# Patient Record
Sex: Female | Born: 1970 | Race: White | Hispanic: No
Health system: Southern US, Community
[De-identification: ages and names within clinical notes are randomized; demographics above are authoritative.]

## PROBLEM LIST (undated history)

## (undated) DIAGNOSIS — I10 Essential (primary) hypertension: Secondary | ICD-10-CM

---

## 1999-12-02 ENCOUNTER — Emergency Department (HOSPITAL_COMMUNITY): Admission: EM | Admit: 1999-12-02 | Discharge: 1999-12-03 | Payer: Self-pay | Admitting: Emergency Medicine

## 2001-02-23 ENCOUNTER — Encounter: Payer: Self-pay | Admitting: Family Medicine

## 2001-02-23 ENCOUNTER — Ambulatory Visit (HOSPITAL_COMMUNITY): Admission: RE | Admit: 2001-02-23 | Discharge: 2001-02-23 | Payer: Self-pay | Admitting: Family Medicine

## 2001-09-14 ENCOUNTER — Ambulatory Visit (HOSPITAL_COMMUNITY): Admission: RE | Admit: 2001-09-14 | Discharge: 2001-09-14 | Payer: Self-pay | Admitting: Family Medicine

## 2001-09-14 ENCOUNTER — Encounter: Payer: Self-pay | Admitting: Family Medicine

## 2003-04-29 ENCOUNTER — Encounter: Payer: Self-pay | Admitting: Family Medicine

## 2003-04-29 ENCOUNTER — Encounter: Admission: RE | Admit: 2003-04-29 | Discharge: 2003-04-29 | Payer: Self-pay | Admitting: Family Medicine

## 2003-04-29 ENCOUNTER — Other Ambulatory Visit: Admission: RE | Admit: 2003-04-29 | Discharge: 2003-04-29 | Payer: Self-pay | Admitting: Family Medicine

## 2003-06-19 ENCOUNTER — Encounter: Payer: Self-pay | Admitting: Gastroenterology

## 2003-06-19 ENCOUNTER — Encounter: Admission: RE | Admit: 2003-06-19 | Discharge: 2003-06-19 | Payer: Self-pay | Admitting: Gastroenterology

## 2003-07-03 ENCOUNTER — Ambulatory Visit (HOSPITAL_COMMUNITY): Admission: RE | Admit: 2003-07-03 | Discharge: 2003-07-03 | Payer: Self-pay | Admitting: Gastroenterology

## 2003-07-03 ENCOUNTER — Encounter: Payer: Self-pay | Admitting: Gastroenterology

## 2003-07-10 ENCOUNTER — Ambulatory Visit (HOSPITAL_COMMUNITY): Admission: RE | Admit: 2003-07-10 | Discharge: 2003-07-10 | Payer: Self-pay | Admitting: Gastroenterology

## 2003-07-29 ENCOUNTER — Ambulatory Visit (HOSPITAL_COMMUNITY): Admission: RE | Admit: 2003-07-29 | Discharge: 2003-07-29 | Payer: Self-pay | Admitting: Surgery

## 2003-07-29 ENCOUNTER — Encounter (INDEPENDENT_AMBULATORY_CARE_PROVIDER_SITE_OTHER): Payer: Self-pay | Admitting: Specialist

## 2004-07-27 ENCOUNTER — Other Ambulatory Visit: Admission: RE | Admit: 2004-07-27 | Discharge: 2004-07-27 | Payer: Self-pay | Admitting: Obstetrics and Gynecology

## 2004-10-31 ENCOUNTER — Ambulatory Visit (HOSPITAL_COMMUNITY): Admission: RE | Admit: 2004-10-31 | Discharge: 2004-10-31 | Payer: Self-pay | Admitting: Urology

## 2006-07-09 ENCOUNTER — Emergency Department (HOSPITAL_COMMUNITY): Admission: EM | Admit: 2006-07-09 | Discharge: 2006-07-10 | Payer: Self-pay | Admitting: Emergency Medicine

## 2009-07-07 ENCOUNTER — Emergency Department (HOSPITAL_BASED_OUTPATIENT_CLINIC_OR_DEPARTMENT_OTHER): Admission: EM | Admit: 2009-07-07 | Discharge: 2009-07-07 | Payer: Self-pay | Admitting: Emergency Medicine

## 2009-07-07 ENCOUNTER — Ambulatory Visit: Payer: Self-pay | Admitting: Radiology

## 2011-01-29 NOTE — Op Note (Signed)
   NAME:  MAEDELL, HEDGER               ACCOUNT NO.:  [id number]   MEDICAL RECORD NO.:  [id number]                   PATIENT TYPE:  AMB   LOCATION:  ENDO                                 FACILITY:  [facility]   PHYSICIAN:  Anselmo Rod, M.D.               DATE OF BIRTH:  05-May-1971   DATE OF PROCEDURE:  07/03/2003  DATE OF DISCHARGE:                                 OPERATIVE REPORT   PROCEDURE PERFORMED:  Colonoscopy.   ENDOSCOPIST:  Charna Elizabeth, M.D.   INSTRUMENT USED:  Olympus video colonoscope.   INDICATIONS FOR PROCEDURE:  Blood in stool with abdominal pain in a 40-year-  old white female.  Rule out colitis (inflammatory bowel disease).  The  patient has had some right lower quadrant tenderness as well.   PREPROCEDURE PREPARATION:  Informed consent was procured from the patient.  The patient was fasted for eight hours prior to the procedure and prepped  with a bottle of magnesium citrate and a gallon of GoLYTELY the night prior  to the procedure.   PREPROCEDURE PHYSICAL:  The patient had stable vital signs.  Neck supple.  Chest clear to auscultation.  S1 and S2 regular.  Abdomen soft with normal  bowel sounds.   DESCRIPTION OF PROCEDURE:  The patient was placed in left lateral decubitus  position and sedated with 10 mg of Versed for the EGD.  No additional  sedation was used for the colonoscopy.  Once the patient was adequately  sedated and maintained on low flow oxygen and continuous cardiac monitoring,  the Olympus video colonoscope was advanced from the rectum to the cecum and  terminal ileum without difficulty.  The entire exam was normal except for  small internal hemorrhoids.   IMPRESSION:  Normal colonoscopy up to the terminal ileum except for some  internal hemorrhoids.   RECOMMENDATIONS:  1. Continue a high fiber diet with liberal fluid intake.  2. Outpatient follow-up in the next two weeks for further recommendations.                              Anselmo Rod, M.D.    JNM/MEDQ  D:  07/03/2003  T:  07/03/2003  Job:  [id number]   cc:   Talmadge Coventry, M.D.  [address], [address]  [place]  [place] [address]  Fax: [phone]

## 2011-01-29 NOTE — Op Note (Signed)
NAME:  Kathleen Klein, Kathleen Klein               ACCOUNT NO.:  [id number]   MEDICAL RECORD NO.:  [id number]                   PATIENT TYPE:  OIB   LOCATION:  2550                                 FACILITY:  [facility]   PHYSICIAN:  Abigail Miyamoto, M.D.              DATE OF BIRTH:  [birth year]   DATE OF PROCEDURE:  07/29/2003  DATE OF DISCHARGE:                                 OPERATIVE REPORT   PREOPERATIVE DIAGNOSIS:  1. Biliary dyskinesia.  2. History of hepatitis.   POSTOPERATIVE DIAGNOSIS:  1. Biliary dyskinesia.  2. History of hepatitis.   PROCEDURE:  1. Laparoscopic cholecystectomy with intraoperative cholangiogram.  2. True cut liver biopsy.   SURGEON:  Abigail Miyamoto, M.D.   ASSISTANT:  Rose Phi. Maple Hudson, M.D.   ANESTHESIA:  General endotracheal anesthesia.   ESTIMATED BLOOD LOSS:  Minimal.   FINDINGS:  The patient was found to have a normal cholangiogram.  The liver  was likewise normal in appearance without any evidence of cirrhosis.   DESCRIPTION OF PROCEDURE:  The patient was brought to the operating room and  identified as Kathleen Klein.  She was placed supine on the  operating table and general anesthesia was induced.  Her abdomen was then  prepped and draped in the usual sterile fashion.  Using a #15 blade a small  transverse incision was made below the umbilicus.  This incision was carried  down to the fascia which was then opened with a scalpel.  Hemostat was then  used to pass into the peritoneal cavity.  Next, a 0 Vicryl pursestring  suture was placed around the fascial opening.  The Hasson port was placed  through the opening and insufflation of the abdomen was begun.  A 12 mm port  was then placed in the patient's epigastrium and two 5 mm ports were placed  in the patient's right flank under direct vision.  The gallbladder was then  grasped and retracted above the liver bed.  Dissection was then carried out  at the base of the gallbladder.  The  cystic duct was dissected out, clipped  once distally, and then partly transected with laparoscopic scissors.  A  small incision was then made in the patient's right upper quadrant.  The  [facility] was passed through this under direct vision and then  placed into the opening of the cystic duct.  The cholangiogram was then  performed under direct fluoroscopy with contrast.  Contrast was seen to flow  into the entire biliary system without evidence of abnormality.  The  cholangiocatheter was then completely removed.  The cystic duct was then  clipped three times proximally and completely transected.  The cystic artery  was identified and clipped twice proximally, once distally and transected as  well.  Prior to removing the gallbladder, a true cut needle was placed  through the small incision in the right upper quadrant.  Two separate liver  biopsies were then performed under direct vision with a  True Cut biopsy  needle.  Specimen was sent to pathology in formalin for identification.  The  biopsy site was then cauterized with electrocautery.  Hemostasis at the  biopsy site appeared to be achieved.  The gallbladder was then slowly  dissected free from the liver bed with the electrocautery.  Once it was free  from the liver bed, it was placed in an Endosac and removed through the  incision at the umbilicus.  The liver bed and dome of the liver were again  examined and hemostasis was felt to be achieved at both sites.  The 0 Vicryl  at the umbilicus was tied in place closing the fascial defect once the  gallbladder was completely removed.  The abdomen was then copiously  irrigated with normal saline.  Hemostasis again appeared to be achieved.  All ports were then removed under direct vision and the abdomen was  deflated.  All incisions were anesthetized with 0.25% Marcaine and closed  with 4-0 Monocryl subcuticular sutures.  Steri-Strips, gauze, and tape were  then applied.  The  patient tolerated the procedure well.  All needle,  sponge, and instrument counts correct at the end of the procedure.  The  patient was then extubated in the operating room and taken in  stable  condition to the recovery room.                                               Abigail Miyamoto, M.D.    DB/MEDQ  D:  07/29/2003  T:  07/29/2003  Job:  [id number]

## 2011-01-29 NOTE — Op Note (Signed)
   NAME:  Kathleen Klein, Kathleen Klein               ACCOUNT NO.:  [id number]   MEDICAL RECORD NO.:  [id number]                   PATIENT TYPE:  AMB   LOCATION:  ENDO                                 FACILITY:  [facility]   PHYSICIAN:  Anselmo Rod, M.D.               DATE OF BIRTH:  [birth year]   DATE OF PROCEDURE:  07/03/2003  DATE OF DISCHARGE:                                 OPERATIVE REPORT   PROCEDURE PERFORMED:  Esophagogastroduodenoscopy.   ENDOSCOPIST:  Charna Elizabeth, M.D.   INSTRUMENT USED:  Olympus video panendoscope.   INDICATIONS FOR PROCEDURE:  Abdominal pain and blood in stool, rule out  peptic ulcer disease, esophagitis, gastritis, etc.   PREPROCEDURE PREPARATION:  Informed consent was procured from the patient.  The patient was fasted for eight hours prior to the procedure.   PREPROCEDURE PHYSICAL:  The patient had stable vital signs.  Neck supple,  chest clear to auscultation.  S1, S2 regular.  Abdomen soft with normal  bowel sounds.   DESCRIPTION OF PROCEDURE:  The patient was placed in the left lateral  decubitus position and sedated with 100 mg of Demerol and 10 mg of Versed  intravenously.  Once the patient was adequately sedated and maintained on  low-flow oxygen and continuous cardiac monitoring, the Olympus video  panendoscope was advanced through the mouth piece over the tongue into the  esophagus under direct vision.  The entire esophagus appeared normal with no  evidence of ring, stricture, masses, esophagitis or Barrett's mucosa.  The  scope was then advanced to the stomach.  The entire gastric mucosa appeared  normal.  So did the proximal small bowel.   IMPRESSION:  Normal esophagogastroduodenoscopy.   RECOMMENDATIONS:  Proceed with a colonoscopy at this time.  Further  recommendations made thereafter.                                                Anselmo Rod, M.D.    JNM/MEDQ  D:  07/03/2003  T:  07/03/2003  Job:  [id number]   cc:   Talmadge Coventry, M.D.  [address], [address]  [place]  [place] [address]  Fax: [phone]

## 2012-01-15 ENCOUNTER — Ambulatory Visit: Payer: Self-pay | Admitting: Orthopaedic Surgery

## 2012-01-15 ENCOUNTER — Inpatient Hospital Stay: Payer: Self-pay | Admitting: General Practice

## 2012-01-16 LAB — CBC WITH DIFFERENTIAL/PLATELET
Basophil #: 0.1 10*3/uL (ref 0.0–0.1)
HCT: 36.9 % (ref 35.0–47.0)
Lymphocyte %: 20.7 %
Neutrophil #: 7.9 10*3/uL — ABNORMAL HIGH (ref 1.4–6.5)
Neutrophil %: 72.7 %
RBC: 4.32 10*6/uL (ref 3.80–5.20)
RDW: 13.9 % (ref 11.5–14.5)

## 2013-02-03 IMAGING — CR DG TIBIA/FIBULA 2V*L*
1 series · 3 of 3 positions shown · non-contrast
Comparison: none

REASON FOR EXAM: fall/leg pain
COMMENTS:

[Series 1: x tib-fib ap left · 0.14mm/px · 3 of 3 slices shown]
[im 1/3]
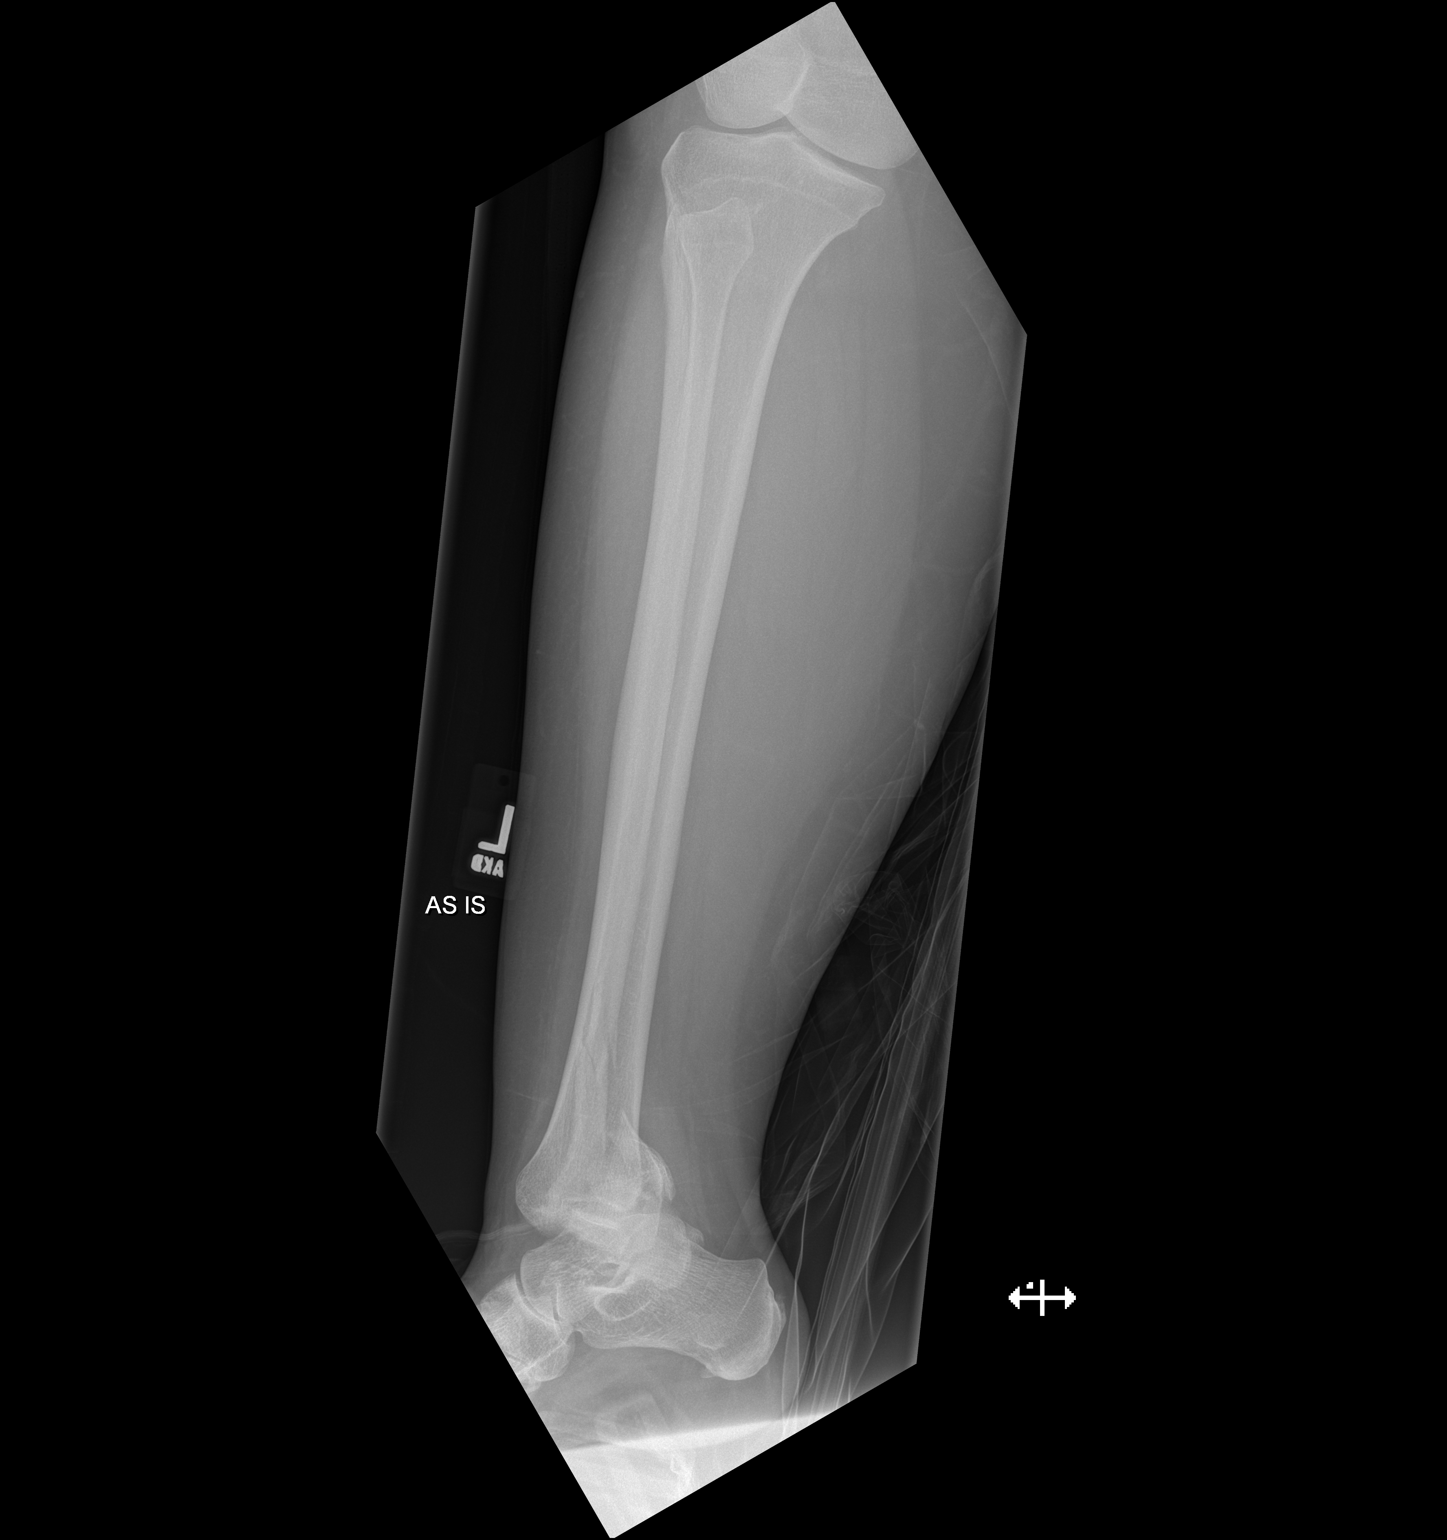
[im 2/3]
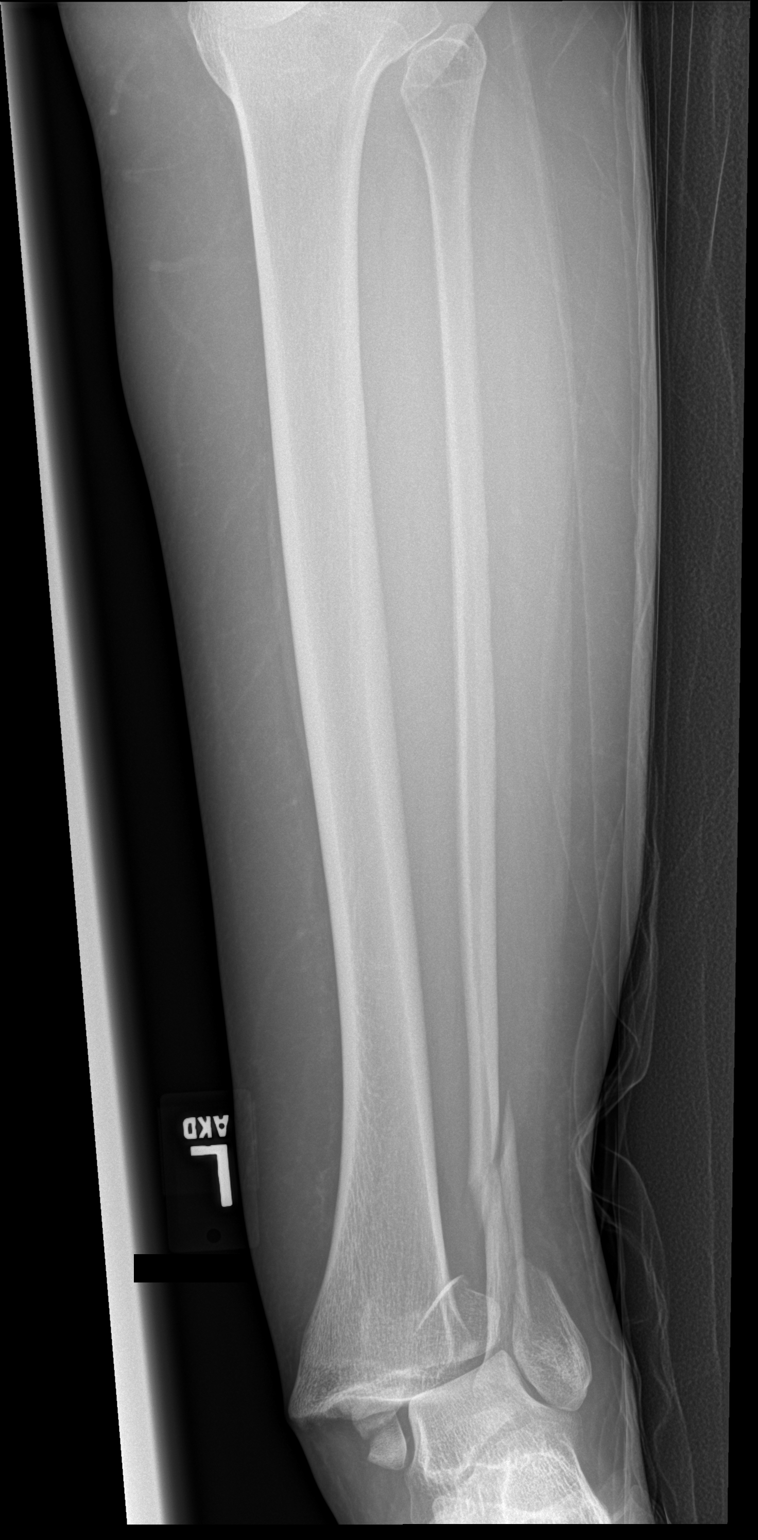
[im 3/3]
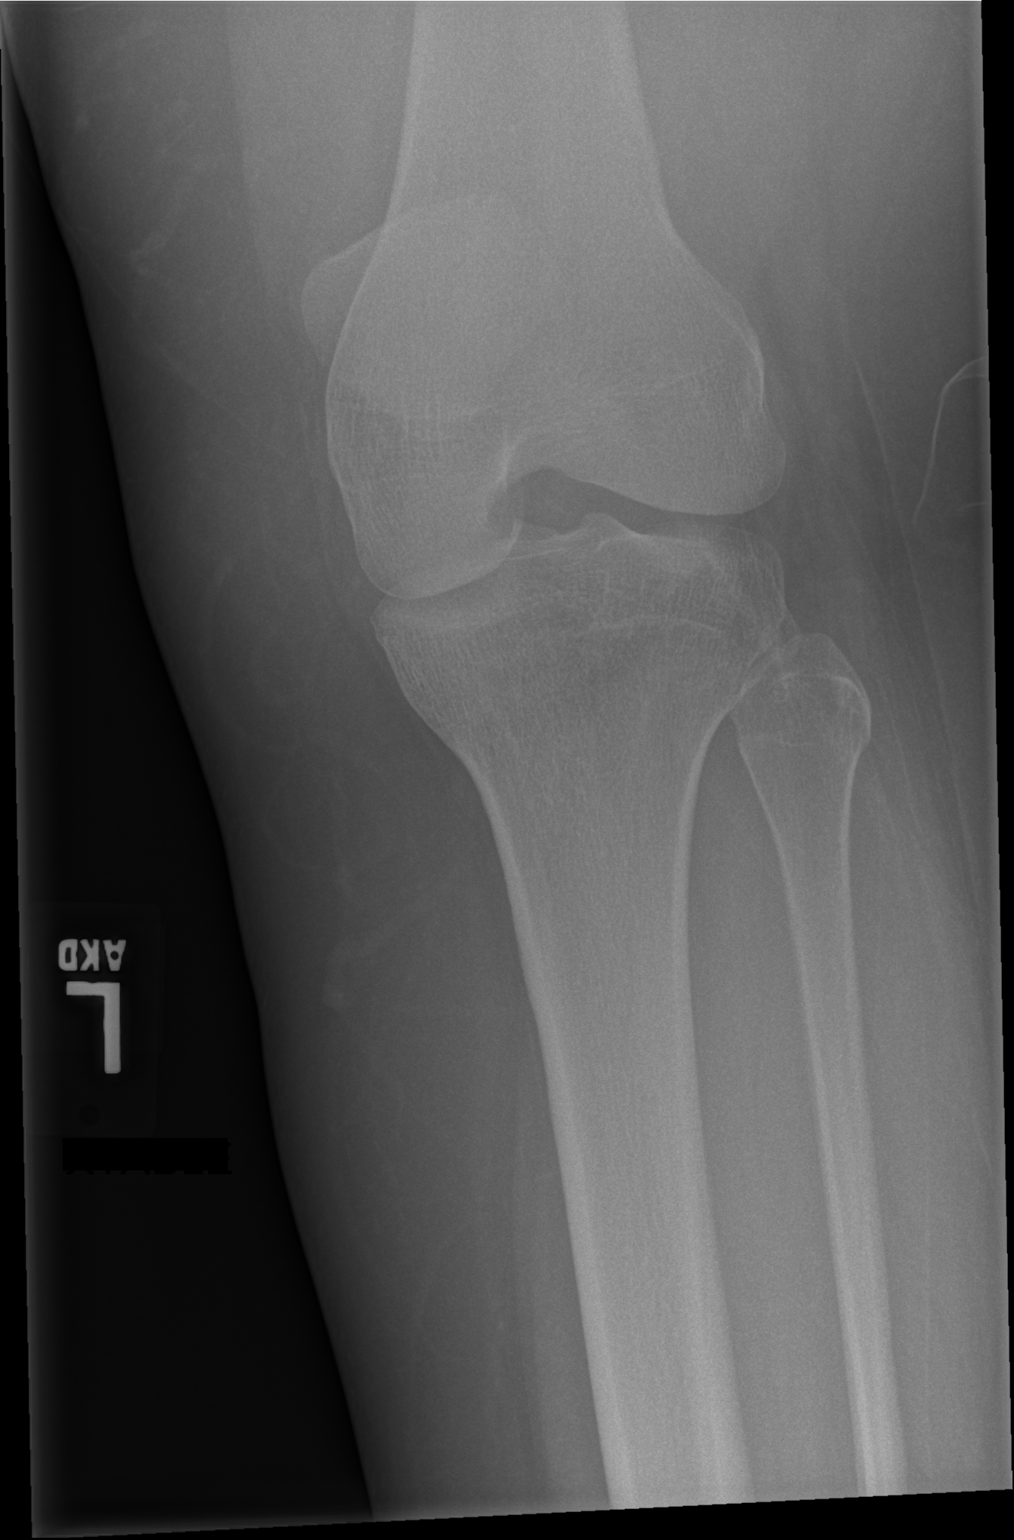

[3 of 3 positions shown; findings below may reference images not displayed]

RESULT:     Three views of the left tibia and fibula are submitted.

The patient has a complex fracture of the ankle. There is a spiral fracture
of the distal aspect of the shaft of the fibula that is mildly displaced.
More proximally the shaft of the fibula is intact. The tibia appears intact
proximally. The observed portions of the knee are intact.
IMPRESSION: There is a complex fracture involving the distal tibia and fibula at the
ankle. A spiral fracture extends into the distal third of the shaft of the
fibula. More proximally I see no fractures of the left tibia or fibula.

[REDACTED]

## 2013-02-04 IMAGING — CR DG C-ARM 1-60 MIN
1 series · 4 of 4 positions shown · non-contrast
Comparison: none

REASON FOR EXAM: ORIF left ankle
COMMENTS:

[Series 6002: left ankle · 4 of 4 slices shown]
[im 1/4]
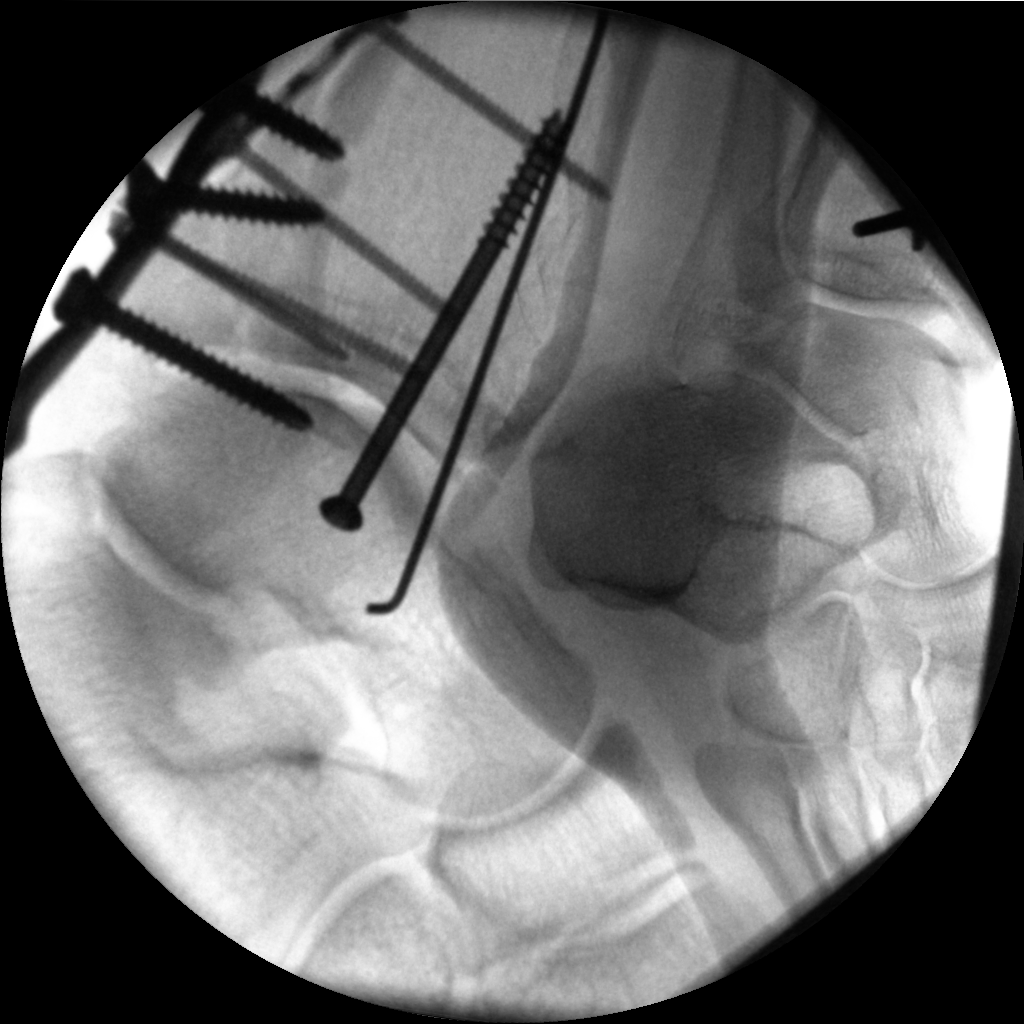
[im 2/4]
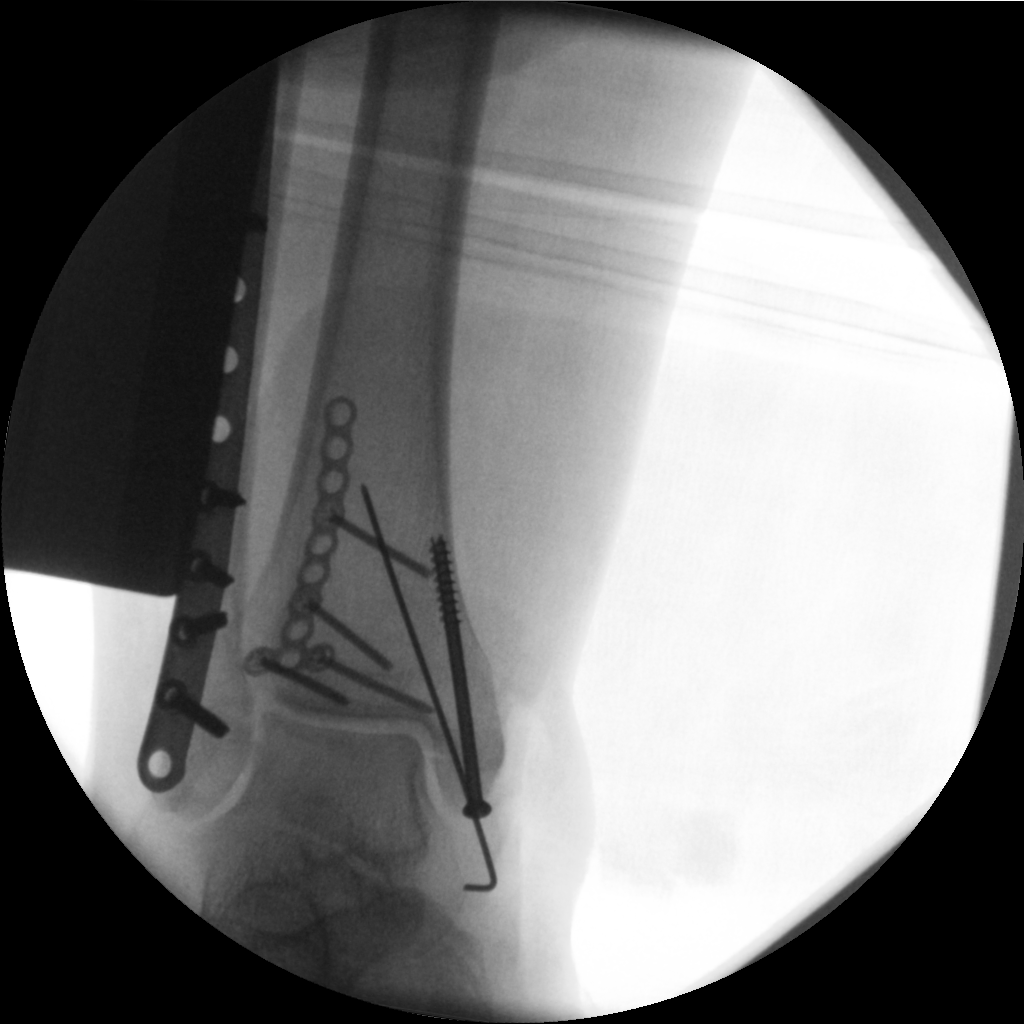
[im 3/4]
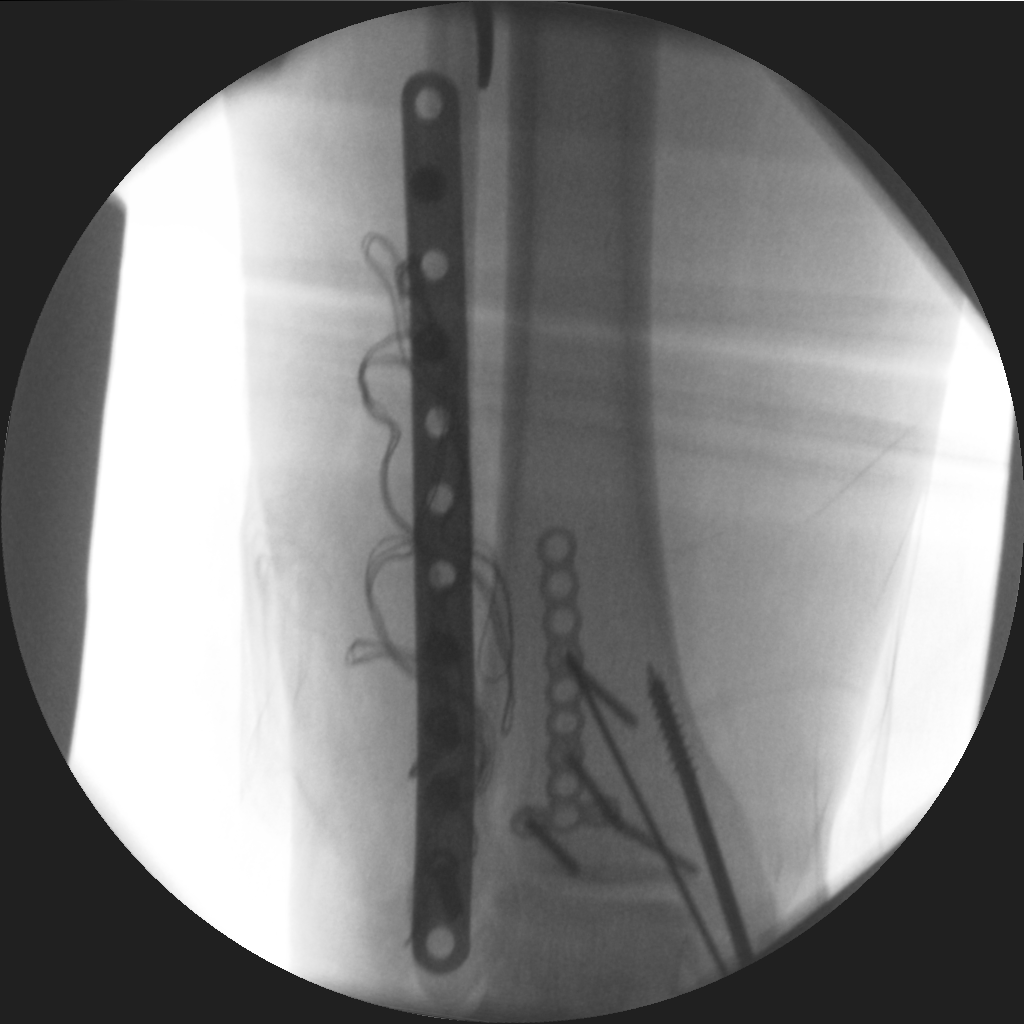
[im 4/4]
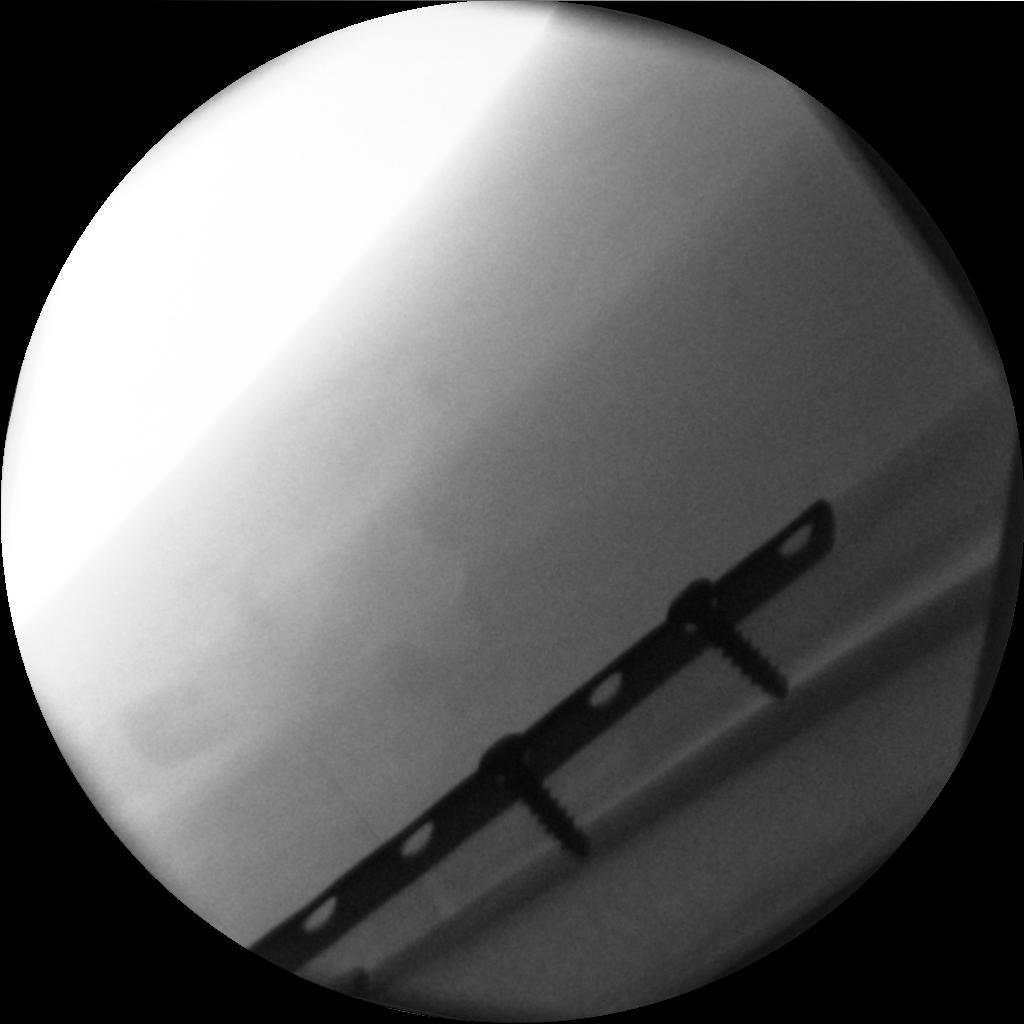

[4 of 4 positions shown; findings below may reference images not displayed]

PROCEDURE:     DXR - DXR C-ARM WITH 2 VIEWS LT ANKLE  - January 16, 2012  [DATE]

RESULT:     Four spot films from the operating room are submitted. The
patient has undergone ORIF for a trimalleolar fracture. An orthopedic side
plate with compression screws have been used to fix the fibular fracture.
The medial and posterior malleolar fractures have been reduced using pins
and screws. The ankle joint mortise is preserved.
IMPRESSION: The patient is undergone ORIF for a trimalleolar fracture.
Alignment now appears near anatomic. Further interpretation is deferred to
Dr. Bolle.

[REDACTED]

## 2015-01-05 NOTE — Discharge Summary (Signed)
PATIENT NAME:  Kathleen Klein, Kathleen Klein MR#:  [id number] DATE OF BIRTH:  06-May-1971  DATE OF ADMISSION:  01/15/2012 DATE OF DISCHARGE:  01/19/2012  ADMITTING DIAGNOSIS: Trimalleolar left ankle fracture.   DISCHARGE DIAGNOSIS: Trimalleolar left ankle fracture.   HISTORY: The patient is a pleasant 44 year old who slipped and fell on wet grass on a hillside earlier on the day of admission. She denied any other problems or any loss of consciousness. She was unable to bear weight or ambulate. She was subsequently brought to [facility] Emergency Room by EMS. X-rays taken in the hospital revealed a trimalleolar ankle fracture. She was seen by Dr. Verlon SettingSikes who consulted the patient on her options. It was recommended that she proceed with having ORIF of the left ankle. The patient understood the risks and benefits and agreed with plans for surgical intervention.   PROCEDURE: Open reduction and internal fixation of left trimalleolar ankle fracture. Date of surgery was 01/16/2012. Surgeon was Dr. Crist Infanteharles Sikes.  ANESTHESIA: General.   IMPLANTS UTILIZED: 12 hole, 1/3 semitubular Synthes plate with cannulated and partially cannulated threaded screws.   HOSPITAL COURSE: The patient tolerated the procedure very well. She had no complications. She was then taken to the PAC-U where she was stabilized and then transferred to the orthopedic floor. The patient began receiving anticoagulation therapy of Lovenox 30 mg subcutaneous every 12 hours per anesthesia protocol. The left leg was elevated on three pillows. Polar Care was applied to the surgical leg maintaining a temperature of 40 to 50 degrees Fahrenheit. A stocking was applied to the nonoperative leg. Her calves have been nontender and free of any evidence of any deep venous thromboses of the lower extremity. She moves her toes well on the surgical leg. She has normal sensation to touch. Minimal swelling of the toes is noted.   The patient's vital signs  have been stable. She has been afebrile. Hemodynamically she was stable. No transfusions were needed.   Physical therapy was initiated on day one for gait training and transfers. Initially this was extremely slow secondary to what appeared to be a lot of emotional upset. Every time anything was done she would cry. However, on day two, she did improve and was able to ambulate 100 feet. On day three, she was able to go up and down four sets of steps. She was noted to be nauseated throughout the hospital course until day of discharge. This was initially felt to be secondary to the morphine which was discontinued but she continued to have symptoms. However, once the oxycodone was discontinued she improved and actually was getting along very well with just the Ultram and Protonix. She had no other complications.   The patient is being discharged to home in improved stable condition. She is to be nonweightbearing to the left leg. A TED stocking to the nonsurgical leg. She may remove this during the nighttime hours. She is placed on a regular diet. She is to use a regular walker. She will follow-up in the clinic on 01/31/2012, sooner if any temperatures or any excessive bleeding. She was given a prescription for Ultram as well as Protonix. If she is unable to afford the Protonix, she may take Pepcid. She is to resume any regular medication that she was on prior to admission.   PAST MEDICAL HISTORY: Obesity, otherwise unremarkable. ____________________________ Van ClinesJon Alonna Bartling, PA jrw:slb D: 01/19/2012 13:56:40 ET T: 01/19/2012 14:36:39 ET JOB#: [id number]  cc: Van ClinesJon Shinika Estelle, PA, <Dictator> Natarsha Hurwitz PA ELECTRONICALLY SIGNED 01/19/2012 20:40

## 2015-01-05 NOTE — Op Note (Signed)
PATIENT NAME:  Kathleen Klein, Kathleen Klein MR#:  [id number] DATE OF BIRTH:  1970-10-28  DATE OF PROCEDURE:  01/16/2012  PREOPERATIVE DIAGNOSIS: Left  trimalleolar ankle fracture.   POSTOPERATIVE DIAGNOSIS: Left trimalleolar ankle fracture.  PROCEDURE: Open reduction internal fixation of left trimalleolar ankle fracture.   SURGEON: Crist Infanteharles Jovin Fester, MD   ANESTHESIA: General.   COMPLICATIONS: None.   SPECIMENS: None.   DRAINS: None.   ESTIMATED BLOOD LOSS: 100 mL.  TOTAL TOURNIQUET TIME: 128 minutes.   INDICATIONS FOR THE PROCEDURE: The patient is a 44 year old female who on the day prior to her procedure presented to the Emergency Department after a slip and fall on a wet grassy hillside, with the above-mentioned injury. She underwent conscious sedation and closed reduction of that injury at the time of her presentation. She was admitted to the hospital for elevation and observation. She was counseled as to the risks, benefits, and alternatives of operative versus nonoperative management and elected to proceed with operative management of her ankle injury. Informed consent was obtained prior to the procedure.   DESCRIPTION OF PROCEDURE IN DETAIL: The patient was brought to the Operating Room and placed in the prone position following induction of general anesthesia. All bony prominences were well padded, and the patient was secured to the table. The left lower extremity was then prepped and draped in a normal sterile fashion. The limb was exsanguinated with an Esmarch bandage and the tourniquet inflated to 300 mmHg. A standard posterior lateral approach to the ankle using the interval between the peroneal musculature and the flexor hallucis longus was used to approach the posterior aspect of the tibia. The posterior malleolar piece was identified. Intervening soft tissue was removed from the fracture site. The fracture was reduced and pinned in provisional fixation. Provisional fixation was checked with AP  and lateral fluoroscopic views. This was then held in place with a buttress antiglide plate with a screw at the apex of the fracture site and subsequent screws to secure the plate distally. Fracture reduction was maintained and was felt to be adequate at the end of the plate application on AP and lateral fluoroscopic views of the ankle. Attention was then directed to fixation of her comminuted distal fibular fracture. The posterior aspect of the fibula was exposed through the same incision as used for her posterior malleolar approach. A 12-hole one-third semitubular Synthes plate was then used to span the comminuted defect. The plate was contoured distally to match her bony anatomy. It was secured to the distal fragment. The distal fragment was then pulled distally under fluoroscopic guidance to its proper orientation and anatomic alignment with regard to the other aspects of the ankle. This length was then held with provisional fixation of the plate to the most proximal fibular fragment. The intervening fracture sites were then checked for anatomic reduction, and the intervening piece was secured with a lag screw when possible, specifically at the most distal fracture site, with compression across this fracture site through the plate. The fracture pattern and degree of comminution did not allow for lag screw fixation of the more proximal fracture line; therefore, the plate was continued proximally to provide a bridge construct over that area. At least two screws were placed into the intervening fracture fragment to hold it in place, and subsequent proximal screws were secured to the proximal fragment. AP, lateral and mortise fluoroscopic views demonstrated overall maintained alignment length and orientation of the fibula with appropriate screw lengths. Attention was then directed to the exposure of  the medial malleolus. A medial midline incision was made over the tip of the medial malleolus, and there was found to  be significant comminution with a very small anterior portion free from the larger more posterior and medial portion of the medial malleolar fragment. The very small individual piece of the distal medial malleolus was pinned with a K-wire and held in place. There was only room for one subsequent cannulated screw and the remaining posterior fragment; therefore, the threaded guidewire was placed and fracture reduction and guidewire placement were checked on AP and lateral fluoroscopic views. This was found to be sufficient. Therefore, the near cortex was drilled with the drill bit for the 4-0 cannulated screws and the partially threaded cannulated screw was inserted. This achieved good compression through the fracture site and fracture reduction was maintained. The remaining K-wire was clipped near the bony surface, bent and impacted to avoid any sharper irritating edges. It was felt that this was the most stable form of fixation that could be reasonably achieved in such a small bony fragment. The wounds were then irrigated sufficiently with normal saline, and the wounds were closed in layered fashion. The deep fascia of the posterolateral wound was closed loosely with 0 Vicryl. Subcutaneous tissues were closed with interrupted 2-0 Vicryl, and the skin was closed with interrupted 3-0 nylon. The patient was placed into a well-padded posterior and medial lateral splint. She was transferred to her stretcher and awakened from her general anesthesia without complication.   DISPOSITION: The patient will be nonweightbearing to the left lower extremity. She will have deep vein thrombosis prophylaxis with aspirin while she is in the hospital and until she is more sufficiently mobile. She will work with physical therapy to increase her mobility. She will have medications for pain control. We will have the patient follow up in clinic in approximately two weeks for repeat evaluation and likely transition to a short-leg cast.  This appointment will be with Dr. Ernest Pine.   ____________________________ Collier Bullock. Tashay Bozich, MD cvs:cbb D: 01/16/2012 15:03:15 ET T: 01/16/2012 15:22:46 ET JOB#: [id number]  cc: Collier Bullock. Meiah Zamudio, MD, <Dictator> Otilio Saber MD ELECTRONICALLY SIGNED 01/23/2012 22:04

## 2015-01-05 NOTE — H&P (Signed)
    Subjective/Chief Complaint left ankle pain and deformity    History of Present Illness slip and fall on a wet grassy hillside earlier today. no blow to the head or LOC.  no other noted injury.  presented via ems for left ankle pain.   Past Med/Surgical Hx:  Obesity:   ALLERGIES:  Latex: Rash  Family and Social History:   Family History Coronary Artery Disease  Cancer    Social History negative tobacco, negative ETOH    Place of Living Home   Review of Systems:   Fever/Chills No    Cough No    Sputum No    Abdominal Pain No    Diarrhea No    Constipation No    Nausea/Vomiting No    SOB/DOE No    Chest Pain No    Dysuria No    Tolerating Diet Yes    Medications/Allergies Reviewed Medications/Allergies reviewed   Physical Exam:   GEN no acute distress    HEENT hearing intact to voice    NECK supple  non tender to palpation and full ROM without pain    RESP normal resp effort    CARD regular rate    ABD denies tenderness  soft    LYMPH negative inguinal lle    SKIN buising and swelling left lower ext    NEURO follows commands, motor/sensory function intact    PSYCH A+O to time, place, person    Additional Comments LLE EHL/FHL intact, TA/GS unable to asses due to pain sensation intact throughout the foot warm and well perfused, 1-2+ dp, good cap refill deformity of leg above ankle, no open wounds but skin tented over medial distal tibia, significant bruising and swelling present     Assessment/Admission Diagnosis left trimaleolar ankle fracture    Plan NWB LLE elevate LLE closed reduuction and splint application in the ED with conscious sedation by the ED CT scan post splinting for preop planning NPO after midnight reasses swelling in the morning, if ammenable to surgery then procede with ORIF   Electronic Signatures: Otilio SaberSikes, Ave Scharnhorst V (MD)  (Signed [id number]-May-13 22:35)  Authored: CHIEF COMPLAINT and HISTORY, PAST MEDICAL/SURGIAL HISTORY,  ALLERGIES, FAMILY AND SOCIAL HISTORY, REVIEW OF SYSTEMS, PHYSICAL EXAM, ASSESSMENT AND PLAN   Last Updated: 04-May-13 22:35 by Otilio SaberSikes, Sincere Liuzzi V (MD)

## 2016-03-01 ENCOUNTER — Inpatient Hospital Stay: Admit: 2016-03-01 | Primary: Family Medicine

## 2016-03-01 NOTE — Progress Notes (Signed)
[facility]  [address]  [facility], [address]    Patient's Name: Lacey ByesSerena Ward   Age: 45 y.o.  Date of Birth: 09/21/1970   Sex: female    Date:   03/01/2016    Insurance:            Session: 1   Revision:   Surgeon:  Dr. Nickie Retorthastanet    Height: 5 f 4 Weight:    298      Lbs.   BMI: 51.3   Pounds Lost since last month: 0               Pounds Gained since last month: 0    Starting Weight: 298  Previous Month???s Weight: 0  Overall Pounds Lost:  Overall Pounds Gained:       Do you smoke?  None    Alcohol intake:  Number of drinks at a time:  None  Number of times a week: None    Class Guidelines    Guidelines are reviewed with patient at the start of every class.    1. Patient understands that weight loss trial classes must be consecutive.  Patient understands if they miss a class, it is their responsibility to contact me to reschedule class.  I will reach out to patient after their first no show.  2.  Patient understands the expectations that weight maintenance/weight loss is expected during the classes.  Failure to demonstrate changes may result in one extra month of weight loss trial, followed by going back to see the surgeon.    3. Patient is also instructed to be doing their labs, blood work, psych visit, support group and any other test that the surgeon has used while they are working on their weight loss trial.    Other Pertinent Information:     Changes Made Since Last Class: Following a low carbohydrate and low sugar diet.    Eating Habits and Behaviors      Today we spent some time talking about the key diet principles.  Patient was given ideas of protein-based snacks including deli meat, low fat cheese, yogurt, hummus and vegetables, protein drink, or a small handful of nuts.  Patient was instructed to start cutting out the empty carbohydrate-based snacks that include chips, cookies, pretzels, crackers.     We talked about carbohydrates and starting to count daily carbohydrate intake to keep less than 100 grams per day.  Patient is encouraged to fill up on meat and vegetables only.    Patient was given a goal of 64 ounces of fluid a day.  This needs to be non-carbonated, no caffeine, and no sugary drinks.        Patient's current diet habits include: 3 meals a day.  States she has cut down to just 1 slice of bread per day.  She states she is trying to be more mindful of her carbohydrates, but is learning that there are carbohydrates in everything.  She is drinking 64 ounces of water per day.  She has stopped all liquid calories.        Physical Activity/Exercise    Comments:  During class, I discussed with patient the importance of getting into an exercise routine.  We talked about how strength training can help one's metabolism  Currently, patient is riding an exercise bike for activity.  Goals have been set.  I have encouraged patient to purchase a pedometer to track their  steps.    Behavior Modification       Comments:   In class, I did a power point on The 100-200 Mindless Margin.  Studies show that the average person will not know if they ate 100 or 200 more or less calories than usual.  This is called the Mindless Margin.  In other words, one can eat up to 20% fewer calories and not activate the deprivation and craving mechanism in the brain.    Patient was instructed to make a modest daily 100-200 calorie reduction in certain things that the body won't notice.  One, 100-200 calorie change and you will lose 10-20 pounds in a year.    We talked about strategies that may help including Food rules: I will not eat the whole dessert, but rather just 4 bites to satisfy me (pre-op).      Patient was given a check off list with a month's worth of days across the top and was encouraged to come up with a food, exercise, and behavior goal.  Every evening if the goal was achieved, they get a check in the  box.  The goal is for it to be filled with check marks.      One 100-calorie change that the patient is going to make is: Increase her activity to a daily routine.    Adela Ports, MS RD  03/01/2016

## 2016-03-01 NOTE — Progress Notes (Signed)
Nutrition Evaluation    Patient's Name: Lacey ByesSerena Rosal   Age: 45 y.o.  Date of Birth: 03/26/1971   Sex: female    Height: 5 f 4 Weight: 298 BMI:  51.3  Starting Weight:  298      Patient has completed a 1 month weight loss trial.  During the classes, we have focused on 3 components including diet, exercise, and behavior modifications.   Upon completion of the weight loss trial, patient had a good understanding of the 3 components.    The diet component of the weight loss trial emphasized on:  ? Label reading and keeping total fat and sugar less than 3 grams per serving  ?  Proper portion sizes  ?  Drinking 64 ounces of water per day  ? Cutting out simple sugar    The exercise component of the weight loss trial emphasized on:  ? The importance of getting into an exercise routine.  ? Ideas and goals for exercise were discussed with the patient.    The behavior component of the weight loss trial emphasized on:  ? Taking 20-30 minutes to eat a meal.  ? Planning ahead to avoid making poor dietary choices.  ? Not eating in front of the TV or computer    Smoking Status:  None  Alcohol Intake:  Number of Drinks at a Time: None  Number of Times a Week: None    Changes made during classes include:  Lower carbohydrates  Reading labels  Lower sugar  Exercising more        Two things that patient learned during this weight loss trial:  Important to read labels    Must decrease calories, carbohydrates, and exercise in order to lose weight.    Summary:  I feel that Lacey Ward has demonstrated appropriate diet changes and is ready to move forward with surgery.  Patient has been briefed on the importance of the protein drinks, vitamins, and the transition of the diet stages. Patient understands that the long-term diet will focus on protein and vegetables.  Patient understand the effects of carbohydrates after surgery and what reactive hypoglycemia is.      Patient is aware that they will be attending pre-op class 2 weeks before  surgery and will get more detailed information on the post-op diet guidelines.    Patient will see me again at 6 weeks post-op. At this 6 week visit, RD will assess how patient is tolerating soft protein and advance to vegetables, if tolerating soft protein without difficulty.  Patient will also see RD again at 9 months post-op.  This visit will assess patient's compliance with current protocol, including diet, vitamins, protein shakes, and exercise.  Post-op diet guidelines will be reinforced.  RD is available for questions and to meet with patient outside of the 6 week and 9 month post-op visit.     Candidate for surgery: Yes  Re-evaluation Date:     Procedure: Gastric Sleeve     Adela PortsJacqueline Browning, MS RD  03/01/2016

## 2016-03-08 NOTE — H&P (Signed)
[facility]  PRE-OP H AND PS    Name:  Lacey Ward, Lacey Ward  MR#:  [id number]  DOB:  11/17/1970  Account #:  [id number]  Date of Adm:  03/17/2016      DATE OF PLANNED SURGERY:  03/17/2016    PRIMARY CARE PHYSICIAN  Dr. Mila MerryKerry Partis, [facility]  [address]  [place], [place]    PREOPERATIVE DIAGNOSES:  Morbid obesity secondary to excess  food calories, with a body mass index of 49.4.    COMORBIDITIES:  Diabetes with a hemoglobin A1c of 6.9 (new  diagnosis), hypertension, obstructive sleep apnea, metabolic  syndrome, gastroesophageal reflux disease, urinary stress  incontinence, and lower back pain.    PLANNED PROCEDURE  1. Laparoscopic sleeve gastrectomy.  2. Intraoperative pneumatic leak testing.  3. Possible laparoscopic hiatal hernia repair.  4. Possible laparoscopic liver biopsy.    HISTORY OF PRESENT ILLNESS:  The patient is a very pleasant 45-  year-old Caucasian female.  She is 5 feet 4-1/2 inches tall and today  weighs 292.2 pounds, giving her a body mass index of 49.4.  At age  45, she weighed about 200 pounds.  She first noticed a weight problem  while in her teens.  She is G2, P2, and reports no gestational diabetes  with her pregnancies.  She actually had weight loss after the birth of  her children because of lots of nausea and vomiting while pregnant.    She had a slow gradual rise to her weight throughout the years.  About  four years ago, she was diagnosed with rheumatoid arthritis and was  started on prednisone.  Since then, her weight has continuing  increased.  She has tried to lose weight through various home diets.  Her job, however, is sedentary.  She has never been on a weight loss  medications, but has been on injectable medicine for diabetes, but this  did not result in any significant weight loss.  Her initial appointment with  me was Feb 11, 2016.  At that time, she weighed 308 pounds.  This   equates with a body mass index of 52.  She was referred to Registered  Dietician, Gaynell FaceJackson Browning, where she has been educated and is felt  to be a good candidate for weight loss surgery.    Her insurance carrier required a 4027-month weight loss trial.  However,  given her BMI of over 50, this was waived.  She has recently started a liver  shrinking diet, which has caused her weight now to drop below a BMI of 50.  She has also been screened by a Clinical Psychologist, Ruel Favorsana Sari, and is  felt to have no psychological contraindications to weight loss surgery.    PAST MEDICAL HISTORY:  Her past medical history is notable for a  new diagnosis of diabetes.  She also has hypertension and obstructive  sleep apnea, and does use a CPAP machine.  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  She also does meet the  diagnostic criteria for metabolic syndrome.  She also has  hyperlipidemia.    PAST SURGICAL HISTORY:  Her past surgeries have included at total  abdominal hysterectomy at age 45.  This was for prolapse.  She  required a redo surgery because of a bladder injury.  She also had a  laparoscopic cholecystectomy for cholelithiasis at age 45.  She has  also  had a left ankle open reduction and internal fixation, and she has  had a left breast biopsy which was benign.    MEDICATIONS  Her current medication list include:  1. Humira injectable twice a month.  2. Prednisone 5 mg a day.  3. Losartan 50 mg a day.  4. Celebrex 1200 mg at night.  5. Arava 20 mg a day.  6. Atorvastatin 10 mg at bedtime.  7. Omeprazole 40 mg once a day for reflux.    ALLERGIES  1. SHE REPORTS AN ALLERGY TO PENICILLIN, LEADING TO A  RASH.  2. SHE ALSO HAS A CONTACT ALLERGY TO LATEX ONLY IT  TOUCHES HER MUCOUS MEMBRANES.  SHE DOES NOT HAVE A  PROBLEM WITH LATEX ON HER SKIN.    FAMILY HISTORY:  Her mother had rheumatoid arthritis and anxiety   problems.  Her father had diabetes.  He was obese.  He had several  myocardial infarctions, the first at age 45.  She has a brother who is  status post gastric bypass for obesity.    SOCIAL HISTORY:  She does not smoke or drink.  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    REVIEW OF SYSTEMS:  Notable for hypertension.  She has no known  heart disease, atrial fibrillation, or heart failure.  [place] did [place] which was [place].  She is  able to do gardening, housework, and laundry.  Long distances of  walking, however, causes back pain, which does limit the length of  time she exercises.  She does have sleep apnea, with a CPAP  machine on 8 cm of water pressure.  No known thyroid disease.  Her  gallbladder has been removed.  She does have gastroesophageal  reflux disease and no symptoms while on her omeprazole medication.  She has never had an upper endoscopy.  She reports no blood  transfusions.  She is a Jehov[employer] Witness and will not accept any blood  transfusion under any circumstances.  She has arthritis, mostly in her  hands and toes.  She also has chronic lower back pain, with bilateral  sciatica.  She has no venous stasis problems and no history of DVT or  pulmonary embolism.  She denies anemia and easy bleeding.  She  does have anxiety and depression.  No history of stroke, migraines, or  kidney stones.  She does have some urinary stress incontinence  symptoms, but does not wear pads regularly.  She has no history of  polycystic ovarian syndrome.  She does report an EGD and  colonoscopy about 15 years ago, which showed nothing remarkable.  Her mammogram last year was okay.    PHYSICAL EXAMINATION  GENERAL:  On physical exam, she is a well-appearing woman in no  acute distress.  She has an android body habitus.  VITAL SIGNS:  Neck circumference is 16 inches.  Her waist   circumference is 52-1/4 inches.  HEAD AND NECK:  Extraocular movements of her eyes and pupillary  light reflex are normal.  On open mouth exam, the entirety of the uvula  can be seen.  She has relative limited opening of her mouth.  She has  no oral lesions.  Her neck is supple, without adenopathy, thyromegaly,  or neck bruits.  LUNGS:  Clear, without wheezing.  HEART:  Regular rate, without murmurs.  ABDOMEN:  Soft and nontender, with  well-healed trocar incisions.  She also has a well-healed Pfannenstiel incision and no hernia  protrusions on Valsalva testing.  There are no areas of point  tenderness or masses.  EXTREMITIES:  Her lower extremities show no significant peripheral  edema, although she has some slight swelling of the left ankle laterally  where she had her surgery in the past.  There are no venous  hypertensive pigmentation skin changes.  NEUROLOGIC:  Her neurologic exam is nonfocal, with a normal gait.    LABORATORY DATA:  Her most recent laboratory [facility]  [facility],  revealed H. pylori antibody IgG testing was negative.  Calcium was  9.1.  Phosphorus 3.2.  Creatinine 0.62.  Intact parathyroid hormone level  of 29.  B12 level was 371, with a folate of 11.76.  Ferritin was 92.  Hemoglobin A1c was 6.9.  TSH was normal at 1.20, although  laboratory studies through her primary care physician's office showed a  glucose of 119, BUN 11, creatinine was 0.61, sodium 139, potassium  3.9, chloride 106, CO2 of 27, AST 25, and ALT 41.  White blood cell  count was 11.1, hematocrit of 41.3, MCV of 90, and platelet count of  294,000.  A1c was 6.6.  This was on December 24, 2015.  On Jan 29, 2016,  her total cholesterol was 246, with triglycerides of 191 and an HDL of  65.  This was a fasting sample and is compatible with the diagnosis of  metabolic syndrome.  She had a barium swallow on July 03, 2015.   This was done for a globus sensation of a foreign body in the  esophagus.  This study was a negative barium swallow.  There was no  significant hiatal hernia and no reflux noted on this study.  There were  no esophageal masses or ulceration.  The 12 mm barium tablet  passed through the esophagus without difficulty.  This was done at  [facility].  C-reactive protein was elevated  on August 19, 2015, at 1.1.    ASSESSMENT AND PLAN:  This is a woman with morbid obesity.  Her  initial body mass index was 52.  She, one week ago, started a liver  shrinking diet and has lost a few pounds.  Her current BMI is 49.4.  She  has several weight-related comorbidities, including a new diagnosis of  diabetes.  She has hypertension, sleep apnea, metabolic syndrome,  hyperlipidemia, and mild urinary stress incontinence, as well as chronic  lower back pain with bilateral sciatica.  I do believe many of these  problems would greatly improve, if not resolve completely, with  successful weight loss surgery.  She has chosen a laparoscopic sleeve  gastrectomy.  She is aware that if a hiatal hernia is found, this will be  repaired concurrently at the time of her operation.  At today's meeting,  using pictures, I have explained to her in great detail the planned  operation.  We have also discussed some of the risks and  complications of surgery, including bleeding; infection; continuation of  her reflux or possible worsening with the sleeve;  dysphagia; thromboembolic complications; and cardiovascular  complications of heart attack, stroke, and death, although quite  unlikely, have been discussed.  We have also discussed the reasons  for poor weight loss after surgery and how to prevent that.  We have  talked about a leak.  I do plan an intraoperative  pneumatic leak test.  She is aware that if a hiatal hernia is found, it, too, will be corrected,  although her past imaging study did not seem to indicate that.  She is   also aware that if her up-to-date liver function tests were to be  abnormal or if the visual appearance of her liver was abnormal, then a  possible liver biopsy may also be undertaken.    With all this in mind, she desires to proceed with the planned  operation.  She has one final meeting with the Bariatric Nurse  tomorrow at [facility]  At that time, up-to-date  laboratory studies, including a CBC, CMP, and EKG will be performed.        Valera Castle, MD    RC / MC  D:  03/08/2016   16:01  T:  03/08/2016   17:54  Job #:  [id number]    Primary Care Physician, Dr. Mila Merry; [facility]; [address]; [place], [place]

## 2016-03-09 ENCOUNTER — Inpatient Hospital Stay: Admit: 2016-03-09 | Payer: BLUE CROSS/BLUE SHIELD | Primary: Family Medicine

## 2016-03-09 ENCOUNTER — Encounter

## 2016-03-09 DIAGNOSIS — Z01818 Encounter for other preprocedural examination: Secondary | ICD-10-CM

## 2016-03-09 LAB — METABOLIC PANEL, COMPREHENSIVE
A-G Ratio: 1.1 (ref 0.8–1.7)
ALT (SGPT): 46 U/L (ref 13–56)
AST (SGOT): 25 U/L (ref 15–37)
Albumin: 3.8 g/dL (ref 3.4–5.0)
Alk. phosphatase: 142 U/L — ABNORMAL HIGH (ref 45–117)
Anion gap: 9 mmol/L (ref 3.0–18)
BUN/Creatinine ratio: 20 (ref 12–20)
BUN: 13 MG/DL (ref 7.0–18)
Bilirubin, total: 0.6 MG/DL (ref 0.2–1.0)
CO2: 26 mmol/L (ref 21–32)
Calcium: 9.4 MG/DL (ref 8.5–10.1)
Chloride: 105 mmol/L (ref 100–108)
Creatinine: 0.64 MG/DL (ref 0.6–1.3)
GFR est AA: >60 mL/min/{1.73_m2} (ref >60)
GFR est non-AA: >60 mL/min/{1.73_m2} (ref >60)
Globulin: 3.6 g/dL (ref 2.0–4.0)
Glucose: 105 mg/dL — ABNORMAL HIGH (ref 74–99)
Potassium: 4.3 mmol/L (ref 3.5–5.5)
Protein, total: 7.4 g/dL (ref 6.4–8.2)
Sodium: 140 mmol/L (ref 136–145)

## 2016-03-09 LAB — CBC W/O DIFF
HCT: 42 % (ref 35.0–45.0)
HGB: 13.3 g/dL (ref 12.0–16.0)
MCH: 27.9 PG (ref 24.0–34.0)
MCHC: 31.7 g/dL (ref 31.0–37.0)
MCV: 88.1 FL (ref 74.0–97.0)
MPV: 10.5 FL (ref 9.2–11.8)
PLATELET: 262 10*3/uL (ref 135–420)
RBC: 4.77 M/uL (ref 4.20–5.30)
RDW: 15 % — ABNORMAL HIGH (ref 11.6–14.5)
WBC: 8.3 10*3/uL (ref 4.6–13.2)

## 2016-03-09 LAB — EKG, 12 LEAD, INITIAL
Atrial Rate: 89 {beats}/min
Calculated P Axis: 53 degrees
Calculated R Axis: 74 degrees
Calculated T Axis: 14 degrees
Diagnosis: NORMAL
P-R Interval: 158 ms
Q-T Interval: 358 ms
QRS Duration: 102 ms
QTC Calculation (Bezet): 435 ms
Ventricular Rate: 89 {beats}/min

## 2016-03-09 LAB — EKG 12-LEAD
Atrial Rate: 89 {beats}/min
Diagnosis: NORMAL
P Axis: 53 degrees
P-R Interval: 158 ms
Q-T Interval: 358 ms
QRS Duration: 102 ms
QTc Calculation (Bazett): 435 ms
R Axis: 74 degrees
T Axis: 14 degrees
Ventricular Rate: 89 {beats}/min

## 2016-03-23 NOTE — Progress Notes (Signed)
7 day Pre-Op LIQUID Diet    Benefits:  o Reducing intake before surgery will shrink your liver by  depleting glycogen. (a form of stored energy)  o Reduced liver size gives better access to stomach during  surgery; which translates to a safer surgery.  o Prevents the "last supper" syndrome  o Experiencing weight loss before the procedure encourages  post-operative compliance and "jump-starts" weight loss    Specifics:  o Start 7 days before surgery:  _____per dr. chastanet_______  o NO SOLID FOODS!!  o Your surgery will be CANCELED if this diet is not followed!!!  o Minimum of 64oz. of fluid daily, including protein drinks.  o No added sugar or carbonated beverages  o Continue to take all your prescribed medication and your vitamin supplements during this preoperative diet phase.    CLEAR LIQUIDS:  ??? Water  ??? Sugar free, non-carbonated beverages (crystal light, propel)  ??? Sugar free popsicles  ??? Sugar free Jell-O  ??? Fat free, reduced sodium broth   ??? Decaffeinated coffee or decaffeinated tea with artificial sweeteners.      PROTEIN:  ??? 60 grams of protein daily (in liquid supplements)  ??? Pre-made protein drinks  ??? Protein powder added to water   ??? 3 gram rule: limit sugar and fat to less than 3 grams per 8oz.  ??? 4-6 oz. low fat/low sugar yogurt OR cottage cheese 3-4 times during the week      [facility] Gastric Bypass and Sleeve Dietary Progression  Patient Name:   Date of Surgery: 03/24/16     Ice Chips start once admitted on floor.     Begin Bariatric Clear Liquid Diet on: 03/25/16    Clear Liquid Diet: 64 oz. of fluid per day  o Low calorie, low sugar, non-carbonated beverages  ? Water, Crystal Light, Propel Water, Sugar Free Jell-O, Sugar Free Popsicles, Bouillon  ? Start protein supplement during this stage. (60-70 grams per day)  ? Getting your fluid in and staying hydrated is your #1 priority!  ? The clear liquid diet will last for 7 days.      Begin Bariatric Soft and Moist on: per dr. Nickie Retortchastanet   ? This stage of the diet will last for 5 week, unless otherwise instructed by your surgeon.  ? Begin:  1 week post-op   ? End:  6 weeks post-op (or when you follow up with the Registered Dietitian)    ? Soft, moist, high protein foods: 3 meals per day plus protein supplements.  o   Portions should emphasize on soft protein.  o Portions will be a MAXIMUM of 1 ounce for solid food and 2-3 ounces for cottage cheese and yogurt.  o Protein supplements should be between meals and provide 30-40 grams per day during soft protein diet.  o Continue to get 64 ounces of fluid in per day.  ? Protein foods that would be ok on the Soft and Moist Diet are:  o Slow transition:  o 1st week on soft protein should focus on: Yogurt, cottage cheese, eggs  o 2nd -4th  week on soft protein diet should focus on: yogurt, cottage cheese, eggs, canned tuna, canned chicken, tilapia, fish (needs to be soft enough to be cut up with a fork)  o 5th week on soft protein diet should focus on: Yogurt, cottage cheese, eggs, canned tuna, canned chicken, tilapia, fish, salmon, chicken breast, or [place].      Remember to continue to get 64 ounces  of fluid daily on ALL Stages.    To be advanced to Bariatric Maintenance Stage of the bariatric diet, follow up with the dietitian 6 weeks post-op, around:        THINGS I NEED TO KNOW BEFORE SURGERY???  1. How many ounces of fluid will you need to drink every day after surgery? _______________    2. List 3 examples of bariatric clear liquids.  ________________________  ________________________  ________________________  3. What is the 3 gram rule?   ______________________________________________________________      4. What is the 30 minute rule?  ______________________________________________________________      5. What are examples of food you will be able to eat on the bariatric soft and moist diet?  _________________________  _________________________  _________________________   6. After surgery I will be able to use a straw.  TRUE    FALSE    7. After surgery I will NOT be able to drink carbonated beverages.  TRUE    FALSE  8. After surgery I will be able to drink caffeine.  TRUE   FALSE    9. What 4 vitamins will you take after surgery?  ______________________           _________________________  _____________________           _________________________  10. How much exercise should you get daily? _________________    11. How long should it take you to eat a meal? ________________    12. The Calcium I have purchased is: ______________________.  This has ________ mg per serving.  I will need to take this ________ times a day.    13. The protein drink I have decided on is: ______________.  This has _________ grams of protein.  I will need to drink ______ of my protein drinks during the full liquid phase and _____ during the soft protein phase.  My protein drinks will give me ______ ounces of fluid.      14. After having a sleeve gastrectomy I will not be able to take NSAIDs (Non-Steroidal Anti-inflammatory Drugs) for ______ weeks.    15. After having a gastric bypass I will not be able to take NSAIDs (Non-Steroidal Anti-Inflammatory Drugs) for _____________.          Pre-op instructions:  1. Shower with the antibacterial soap provided the 3 days prior to surgery.  See instructions for details.  2. Pre-admission testing will contact you 1 week before surgery  3. [facility] will contact you the day before surgery to confirm your surgery time.  ? Email or call your surgery scheduler if you have not heard from them by 3pm  4. Nothing to eat or drink (NPO) after midnight the night before surgery.  ? Take any evening medications by 11pm  5. Wear comfortable clothing.  6. Do not bring any valuables.  7. Bring insurance card, prescription card, photo ID and credit card to the hospital.  **Discuss all home medications with your surgeon at you pre-op  appointment.  Your surgeon will inform you of what medications to stop before surgery and what medications to take the day of surgery.    **STOP all NSAIDS (Ibuprofen, Aleve, Advil, Naprosyn etc.) and Aspirin 7 days before your surgery      Important Information:  1. No lifting over 15 lbs. for 6 weeks post-op  2. No driving for 8-11 days after surgery ??? must be off pain medication.  3. No smoking EVER again!  4.  You will NOT be able to take any Non-Steroidal Anti-inflammatories (NSAIDS), Aspirin or Steroids  a. Bypass/revision patients - EVER again. (Tylenol only)  b. Sleeve patients ??? 6 weeks after surgery  5. Pulse/temperature- twice daily for 2 weeks post-op   6. Avoid pregnancy for 18 months  7. Key Diet principles:  a. Vitamin/mineral supplements-[place] Complete, Calcium citrate, Vitamin D3, Vitamin B12  b. Protein supplements ??? 60-70 grams/day  c. Minimum 64 oz. fluid per day      What to Expect in the Hospital:  Pre-op area:  o 2nd floor   o Arrive 2 hours before surgery  o Lovenox (blood thinner)  o Facilities manager  o SCD???s  o Sign consent  o Anesthesia  Start IV    Operating Room:    o Gastric bypass: 2- 2 ?? hours  o Sleeve gastrectomy: 1-1 ?? hours  o Revision: 2-3 hours    PACU(Post Anesthesia Care Unit):  o Nasal cannula  o EKG monitor  o Blood pressure cuff  o Pulse Ox  o Foley Catheter  o SCD???s  o No family allowed  o Minimum one hour      5 [place] (Day of Surgery):  o Private room  o 1-2 oz. ice chips per hour (except Chastanet patients-mouth swabs only)  o IV Dilaudid  or liquid pain medication as needed for pain  o Discontinue foley catheter  o Walk in the evening  o One family member can spend the night (must 18 years or older)  o Cell phone/computers ??? OK    5 [place] (Post-op Day 1/Post-op Day 2):  o 7am - Gastrograffin swallow study (X-ray) for:  o All sleeve gastrectomy patients  o All Dr. Nickie Retort patients  o All revision patients   o Discontinue ???nasal cannula and heart rate monitor   o Start bariatric clear liquid diet ??? water, crystal light, broth, Jell-O and protein supplement  o Slowly increase to 3 oz. clear liquids and 1 oz. protein supplement per hour  o Oral pain medication as needed for pain ??? communicate with your nurse  o Goal:  48oz. liquid per day  o Discharge instructions/Lovenox self-injection instructions (if prescribed)  o WALK!!    Post-op Goals:  1. Void within 8 hours of your catheter being removed  2. Pain is well controlled with oral pain medication  3. Nausea is under control/No vomiting  4. Tolerating 3 oz. of clear liquids and 1 oz. protein supplement per hour for 3 consecutive hours.    Post-op Follow-up Labs will need to be completed 1-2 weeks BEFORE your 3 month, 6 month and yearly visits.  These labs are required and your appointment will be rescheduled if they are not completed.    My surgical procedure and post-operative hospital stay has been discussed with me and I have been given the opportunity to ask any questions I may have.    Patient Signature: ____________________________  Date: ________    Following Gastric Bypass surgery you will no longer be able to take NSAIDs (Non-Steriodal Anti-Inflammatory Drugs) EVER again.  NSAIDs are the leading cause of stomach ulcers following Gastric Bypass surgery.  (Patients having the Gastric Sleeve will not be able to take NSAIDs for 6 weeks following surgery.)      MOST COMMONLY TAKEN    NSAIDs    to be AVOIDED!!    1. Ibuprofen  2. Advil  3. Motrin  4. Excedrin  5. Aspirin  6. Celebrex  7. Naproxen  8. Aleve  9. Voltaren  10.  Mobic    Attached is an extensive list of additional NSAIDs that cannot be taken following surgery.  Please be sure to review this list when you are being prescribed new medications. This list covers the majority of NSAIDs on the market.  Be aware that additional NSAIDs do exist and new medications are being introduced regularly. You must be your own advocate!!             Non-Steriodal Anti-Inflammatory Drugs (NSAIDs)  ? The following is a list of NSAIDS that are NEVER to be taken again after Gastric Bypass surgery    Ibuprofen which is also known as : Advil, Advil Childrens, Advil Junior Strength, Advil Liquigel, Advil Migraine, Advil Pediatric, Childrens Ibuprofen Berry, Genpril, IBU, Midol IB, Midol Maximum Strength Cramp Formula, Dolgesic, Motrin Childrens, Motrin IB, Motrin Infant Drops, Motrin Junior Strength, Motrin Migraine Pain, Nuprin, Migraine Liqui-gels, Ibu-Tab 200, Cap-Profen, Tab-Profen, Profen, Ibuprohm, Children???s Elixsure, IB Pro, Vicoprofen, Combunox, A-G Profen, Actiprofen, Addaprin, Advil Infants Concentrated Drops, Caldolor, [place], Q-Profen, Ibifon 600, Ibren, Norco, Midol Cramps & Bodyaches, [place], Saleto-200, [place], [place], Uni-Pro, Wal-Profen    Aspirin which has the brand name: Arthritis Pain, Aspergum, Aspir-Low, Aspirin Lite Coat, Bayer Aspirin, Bufferin, Easprin, Ecotrin, Empirin, Fasprin, [place], Halfprin, [place] Aspirin, [place] Aspirin, Excedrin    Ketoprofen which is known as: Orudis KT, Oruvail, Actron.    Sulindac which is sold as: Clinoril.    Naproxen is one of the most common NSAIDs, and is sold as: Aleve, Naprosyn, EC-Naprosyn, Naprelan, Anaprox, All Day Pain Relief, Aflaxen, All Day Relief, Anaprox-DS, Comfort Pac  With Naproxen,  Aleve Caplet, Aleve Easy Open Arthritis, Aleve Gelcap,  Anaprox-DS, EC-Naprosyn, Leader Naproxen Sodium, Midol Extended Relief, Naprelan ???375?, Naprelan ???500?, Naprelan ???750?, Prevacid NapraPac 375,  Prevacid NapraPac, Naprapac, Vimovo.    Etodolac is sold under the brand name: Lodine XL, Lodine.    Fenoprofen can be found on the market as: Nalfon, Nalfon 200.    Diclofenac sold as: Arthrotec, Cataflam, Voltaren, Cambia, Voltaren-XR, Zipsor.    Flurbiprofen sold as: Asaid.    Ketorolac is sold under the brand name: Sprix, Toradol, Toradol IM, Toradol IV/IM.     Piroxicam is found on the market as: Feldene.    Indomethacin known as: Indocin SR, Indocin, Indo-Lemmon, Indomethegan, Indocin IV.    Mefenamic Acid sold as: Ponstel.    Meloxicam is sold under the brand name: Mobic    Nabumetone which is sold as: Relafen.    Oxaprozin which has the brand name: Daypro    Ketoprofen which has the brand name: Actron, Orudis KT, Orudis, Oruvail    Famotidine and Ibuprofen can be found as: Duexis    Meclofenamate sold as: Meclomen    Tolmetin which can be found on the marked as: Tolectin, Tolectin 600, Tolectin DS    Salsalate which is sold as: Disalcid.    Celecoxib which is sold as: Celebrex    PRE-OP CHECKLIST    THINGS TO DO AT MY PRE-OP APPOINTMENT WITH MY SURGEON:  ? Discuss ALL my current medications with my surgeon. Know what medications needs to be stopped before surgery AND what medications need to be taken on the morning of surgery.    ? blood pressure/diuretic medications.  ? Coumadin, Plavix or other blood thinners    ? Stop the following diabetic medications (if applicable): ___________n/a_________________________________________on: ______________  ? Use Regular Insulin (Novolog Pen) according to the following sliding scale:  Insulin Sliding Scale  Blood sugar:  Amount of insulin:  Under 150  no insulin  150-200  2 units  201-250  4 units  251-300  6 units  301-350  8 units  351-400  10 units  400 or greater 12 units and call physician [phone]    THINGS TO DO FOLLOWING the PRE-OP CLASS:  ? Read through all information given to me by:  ? My dietitian Annice Pih or [place])  ? Liz/Pam at the Pre-op class    ? Contact my insurance company to find out the out of pocket cost of Lovenox (enoxaparin). $____________      THINGS TO DO BEFORE SURGERY:    ? Start the pre-op liquid diet on: ___per dr. chastanet________    ? Stop all NSAIDS (see attached sheet with list of NSAIDs) and Aspirin 7 days before my surgery: __7/5/17_________   ? Contact my doctor(PCP or surgeon) regarding stopping Coumadin, Plavix or other blood thinners    ? Purchase bariatric clear liquids (Crystal Lite, sugar free Jell-O, broth, sugar free popsicles, protein supplement) and bariatric soft and moist foods (low fat yogurt, cottage cheese, eggs, tuna, fish, chicken) so that I???m prepared when I get home from the hospital.     ? Purchase all vitamins that will be required following surgery.  1. Chewable multivitamin ??? 2 per day(ex: Flintstones)  2. Calcium Citrate - 1500mg  (500mg  3 times a day)  3. Vitamin B12 - daily  4. Vitamin D3 - 5000IU daily    ? Create a system to keep track of how many ounces of fluid I am drinking daily.  ? Post-op GOAL: 64oz per day    ? Purchase a protein supplement that I like:  ? Brand: ______________    ? Ounces: __________   ? Grams of protein per serving: _________

## 2016-03-23 NOTE — Progress Notes (Signed)
CLINICAL NUTRITION PRE-OPERATIVE EDUCATION    Patient's Name: Lacey Ward   Age: 45 y.o.  Date of Birth: 02/03/1971   Sex: female    Education & Materials Provided:   x Excel Menu/Allowable Facilities manageroods/ Shopping List   x  Supplemental Resource Guide: MVI, B12, Calcium Citrate, Vitamin D         recommendations  x  Protein Supplement Information  x  Fluid Requirements/ No Straws  x  No Caffeine or Carbonation   x  No alcohol for 1-Year Post-Op                     x  No Snacks or No Concentrated Sweets     x  Exercising   x  Support System at [employer]/ List of Support Group meetings.   Support System after surgery includes: x     x  Key Diet Principles            x  Addressed Current Habits/Changes to Make   x Patient has been educated on the liquid diet to begin 1 week prior to surgery.           Attendance of support group: Yes  When:     Summary:  Patient has completed 1 visits with me.  During these nutrition visits, we focused on dietary changes, behavior changes, and the importance of establishing an exercise routine.    At today's session, patient was educated on the post-op diet protocol.  Patient understands the importance of keeping total fat and sugar less than 3 grams per serving.  Patient is aware of the transition of the diet stages and is aware that they will be on clear liquids for 7days, followed by soft protein for 5 weeks.  Patient understands the body needs ~ 60-70 grams of protein per day.  During the liquid phase, patient will need 60 grams of protein from shakes.  Once eating soft protein, patient will only need 1 shake per day.  Patient is aware of the importance of the vitamins and protein drinks.      Patient has also been educated on the pre-op liquid diet, which will range from 1-2 weeks (depending on surgeon).  Patient understands that failure to comply in pre-op liquid diet could result in surgery being canceled.      Patient's 6 week post-op nutrition appointment has been scheduled for:  August 24 at 245.  Ok to proceed.  At this 6 week visit, RD will assess how patient is tolerating soft protein and advance to vegetables, if tolerating soft protein without difficulty.  Patient will also see RD again at 9 months post-op.  This visit will assess patient's compliance with current protocol, including diet, vitamins, protein shakes, and exercise.  Post-op diet guidelines will be reinforced.  RD is available for questions and to meet with patient outside of the 6 week and 9 month post-op visit.        Candidate for surgery: Yes  Re-evaluation Date:     Adela PortsJacqueline Browning, MS RD  03/23/2016

## 2016-03-24 ENCOUNTER — Inpatient Hospital Stay
Admit: 2016-03-24 | Discharge: 2016-03-26 | Disposition: A | Discharge status: Home / Self Care | Payer: BLUE CROSS/BLUE SHIELD | Attending: Surgery | Admitting: Surgery

## 2016-03-24 LAB — GLUCOSE, POC
Glucose (POC): 107 mg/dL (ref 70–110)
Glucose (POC): 193 mg/dL — ABNORMAL HIGH (ref 70–110)

## 2016-03-24 MED ORDER — LABETALOL 5 MG/ML IV SYRINGE
20 mg/4 mL (5 mg/mL) | INTRAVENOUS | Status: AC
Start: 2016-03-24 — End: 2016-03-24
  Administered 2016-03-24: 18:00:00 via INTRAVENOUS

## 2016-03-24 MED ORDER — HYDROMORPHONE (PF) 1 MG/ML IJ SOLN
1 mg/mL | INTRAMUSCULAR | Status: DC | PRN
Start: 2016-03-24 — End: 2016-03-26

## 2016-03-24 MED ORDER — LACTATED RINGERS IV
INTRAVENOUS | Status: DC
Start: 2016-03-24 — End: 2016-03-26
  Administered 2016-03-24 – 2016-03-25 (×5): via INTRAVENOUS

## 2016-03-24 MED ORDER — ROCURONIUM 10 MG/ML IV
10 mg/mL | INTRAVENOUS | Status: AC
Start: 2016-03-24 — End: ?

## 2016-03-24 MED ORDER — GLUCAGON 1 MG INJECTION
1 mg | INTRAMUSCULAR | Status: DC | PRN
Start: 2016-03-24 — End: 2016-03-24

## 2016-03-24 MED ORDER — INSULIN LISPRO 100 UNIT/ML INJECTION
100 unit/mL | Freq: Four times a day (QID) | SUBCUTANEOUS | Status: DC
Start: 2016-03-24 — End: 2016-03-26
  Administered 2016-03-24: 17:00:00 via SUBCUTANEOUS

## 2016-03-24 MED ORDER — BUPIVACAINE LIPOSOME (PF) 266 MG/20 ML (13.3 MG/ML) SUSP, INFILTRATION
1.3 % (13.3 mg/mL) | Status: DC | PRN
Start: 2016-03-24 — End: 2016-03-24
  Administered 2016-03-24: 15:00:00

## 2016-03-24 MED ORDER — PROMETHAZINE 25 MG/ML INJECTION
25 mg/mL | INTRAMUSCULAR | Status: AC
Start: 2016-03-24 — End: 2016-03-25

## 2016-03-24 MED ORDER — LABETALOL 5 MG/ML IV SOLN
5 mg/mL | INTRAVENOUS | Status: DC | PRN
Start: 2016-03-24 — End: 2016-03-24

## 2016-03-24 MED ORDER — PROPOFOL 10 MG/ML IV EMUL
10 mg/mL | INTRAVENOUS | Status: AC
Start: 2016-03-24 — End: ?

## 2016-03-24 MED ORDER — DEXTROSE 50% IN WATER (D50W) IV SYRG
INTRAVENOUS | Status: DC | PRN
Start: 2016-03-24 — End: 2016-03-24

## 2016-03-24 MED ORDER — HYDROMORPHONE (PF) 2 MG/ML IJ SOLN
2 mg/mL | INTRAMUSCULAR | Status: DC | PRN
Start: 2016-03-24 — End: 2016-03-24
  Administered 2016-03-24 (×3): via INTRAVENOUS

## 2016-03-24 MED ORDER — PROMETHAZINE 25 MG/ML INJECTION
25 mg/mL | Freq: Four times a day (QID) | INTRAMUSCULAR | Status: DC | PRN
Start: 2016-03-24 — End: 2016-03-26
  Administered 2016-03-24 – 2016-03-25 (×3): via INTRAVENOUS

## 2016-03-24 MED ORDER — FENTANYL CITRATE (PF) 50 MCG/ML IJ SOLN
50 mcg/mL | INTRAMUSCULAR | Status: AC
Start: 2016-03-24 — End: ?

## 2016-03-24 MED ORDER — HYDROCORTISONE SOD SUCCINATE (PF) 100 MG/2 ML SOLUTION FOR INJECTION
100 mg/2 mL | Freq: Once | INTRAMUSCULAR | Status: AC
Start: 2016-03-24 — End: 2016-03-24
  Administered 2016-03-24: 12:00:00 via INTRAVENOUS

## 2016-03-24 MED ORDER — GLUCOSE 4 GRAM CHEWABLE TAB
4 gram | ORAL | Status: DC | PRN
Start: 2016-03-24 — End: 2016-03-24

## 2016-03-24 MED ORDER — FAMOTIDINE (PF) 20 MG/2 ML IV
202 mg/2 mL | Freq: Once | INTRAVENOUS | Status: AC
Start: 2016-03-24 — End: 2016-03-24
  Administered 2016-03-24: 12:00:00 via INTRAVENOUS

## 2016-03-24 MED ORDER — BUPIVACAINE LIPOSOME (PF) 266 MG/20 ML (13.3 MG/ML) SUSP, INFILTRATION
1.3 % (13.3 mg/mL) | Freq: Once | Status: AC
Start: 2016-03-24 — End: 2016-03-24

## 2016-03-24 MED ORDER — HYDROMORPHONE (PF) 1 MG/ML IJ SOLN
1 mg/mL | INTRAMUSCULAR | Status: DC | PRN
Start: 2016-03-24 — End: 2016-03-26
  Administered 2016-03-25: 04:00:00 via INTRAVENOUS

## 2016-03-24 MED ORDER — SODIUM CHLORIDE 0.9 % INJECTION
INTRAMUSCULAR | Status: AC
Start: 2016-03-24 — End: ?

## 2016-03-24 MED ORDER — ONDANSETRON (PF) 4 MG/2 ML INJECTION
4 mg/2 mL | Freq: Four times a day (QID) | INTRAMUSCULAR | Status: DC | PRN
Start: 2016-03-24 — End: 2016-03-26
  Administered 2016-03-25 (×2): via INTRAVENOUS

## 2016-03-24 MED ORDER — DEXTROSE 50% IN WATER (D50W) IV SYRG
INTRAVENOUS | Status: DC | PRN
Start: 2016-03-24 — End: 2016-03-26

## 2016-03-24 MED ORDER — ONDANSETRON (PF) 4 MG/2 ML INJECTION
4 mg/2 mL | INTRAMUSCULAR | Status: DC | PRN
Start: 2016-03-24 — End: 2016-03-24
  Administered 2016-03-24: 16:00:00 via INTRAVENOUS

## 2016-03-24 MED ORDER — ACETAMINOPHEN 120 MG RECTAL SUPPOSITORY
120 mg | RECTAL | Status: DC | PRN
Start: 2016-03-24 — End: 2016-03-24
  Administered 2016-03-24: 14:00:00 via RECTAL

## 2016-03-24 MED ORDER — CEFOTETAN 2 GRAM SOLUTION FOR INJECTION
2 gram | INTRAMUSCULAR | Status: DC | PRN
Start: 2016-03-24 — End: 2016-03-24
  Administered 2016-03-24: 14:00:00 via INTRAVENOUS

## 2016-03-24 MED ORDER — NEOSTIGMINE METHYLSULFATE 5 MG/5 ML (1 MG/ML) IV SYRINGE
5 mg/ mL (1 mg/mL) | INTRAVENOUS | Status: DC | PRN
Start: 2016-03-24 — End: 2016-03-24
  Administered 2016-03-24: 16:00:00 via INTRAVENOUS

## 2016-03-24 MED ORDER — ROCURONIUM 10 MG/ML IV
10 mg/mL | INTRAVENOUS | Status: DC | PRN
Start: 2016-03-24 — End: 2016-03-24
  Administered 2016-03-24 (×2): via INTRAVENOUS

## 2016-03-24 MED ORDER — SODIUM CHLORIDE 0.9 % IJ SYRG
INTRAMUSCULAR | Status: DC | PRN
Start: 2016-03-24 — End: 2016-03-24
  Administered 2016-03-24: 17:00:00 via INTRAVENOUS

## 2016-03-24 MED ORDER — ACETAMINOPHEN 650 MG RECTAL SUPPOSITORY
650 mg | RECTAL | Status: AC
Start: 2016-03-24 — End: ?

## 2016-03-24 MED ORDER — LIDOCAINE (PF) 10 MG/ML (1 %) IJ SOLN
10 mg/mL (1 %) | INTRAMUSCULAR | Status: DC | PRN
Start: 2016-03-24 — End: 2016-03-24

## 2016-03-24 MED ORDER — HEPARIN (PORCINE) 5,000 UNIT/ML IJ SOLN
5000 unit/mL | Freq: Once | INTRAMUSCULAR | Status: AC
Start: 2016-03-24 — End: 2016-03-24
  Administered 2016-03-24: 12:00:00 via SUBCUTANEOUS

## 2016-03-24 MED ORDER — FENTANYL CITRATE (PF) 50 MCG/ML IJ SOLN
50 mcg/mL | INTRAMUSCULAR | Status: DC | PRN
Start: 2016-03-24 — End: 2016-03-24
  Administered 2016-03-24 (×2): via INTRAVENOUS

## 2016-03-24 MED ORDER — LACTATED RINGERS IV
INTRAVENOUS | Status: DC
Start: 2016-03-24 — End: 2016-03-24
  Administered 2016-03-24: 18:00:00 via INTRAVENOUS

## 2016-03-24 MED ORDER — SCOPOLAMINE (1.3-1.5) MG 72 HR TRANSDERM PATCH
1 mg over 3 days | Freq: Once | TRANSDERMAL | Status: DC
Start: 2016-03-24 — End: 2016-03-26

## 2016-03-24 MED ORDER — ONDANSETRON (PF) 4 MG/2 ML INJECTION
4 mg/2 mL | INTRAMUSCULAR | Status: AC
Start: 2016-03-24 — End: 2016-03-24
  Administered 2016-03-24: 18:00:00 via INTRAVENOUS

## 2016-03-24 MED ORDER — LACTATED RINGERS IV
INTRAVENOUS | Status: DC | PRN
Start: 2016-03-24 — End: 2016-03-24
  Administered 2016-03-24 (×2): via INTRAVENOUS

## 2016-03-24 MED ORDER — LACTATED RINGERS BOLUS IV
Freq: Once | INTRAVENOUS | Status: AC
Start: 2016-03-24 — End: 2016-03-24
  Administered 2016-03-24: 17:00:00 via INTRAVENOUS

## 2016-03-24 MED ORDER — SODIUM CHLORIDE 0.9 % INJECTION
20 mg/2 mL | Freq: Once | INTRAMUSCULAR | Status: DC
Start: 2016-03-24 — End: 2016-03-24

## 2016-03-24 MED ORDER — SUCCINYLCHOLINE CHLORIDE 20 MG/ML INJECTION
20 mg/mL | INTRAMUSCULAR | Status: AC
Start: 2016-03-24 — End: ?

## 2016-03-24 MED ORDER — MIDAZOLAM 1 MG/ML IJ SOLN
1 mg/mL | INTRAMUSCULAR | Status: DC | PRN
Start: 2016-03-24 — End: 2016-03-24
  Administered 2016-03-24: 14:00:00 via INTRAVENOUS

## 2016-03-24 MED ORDER — ACETAMINOPHEN 650 MG RECTAL SUPPOSITORY
650 mg | Freq: Once | RECTAL | Status: AC
Start: 2016-03-24 — End: 2016-03-24

## 2016-03-24 MED ORDER — METOPROLOL TARTRATE 5 MG/5 ML IV SOLN
5 mg/ mL | INTRAVENOUS | Status: DC | PRN
Start: 2016-03-24 — End: 2016-03-24
  Administered 2016-03-24 (×2): via INTRAVENOUS

## 2016-03-24 MED ORDER — SODIUM CHLORIDE 0.9 % IV
INTRAVENOUS | Status: AC
Start: 2016-03-24 — End: ?

## 2016-03-24 MED ORDER — GLUCAGON 1 MG INJECTION
1 mg | INTRAMUSCULAR | Status: DC | PRN
Start: 2016-03-24 — End: 2016-03-26

## 2016-03-24 MED ORDER — SUCCINYLCHOLINE CHLORIDE 20 MG/ML INJECTION
20 mg/mL | INTRAMUSCULAR | Status: DC | PRN
Start: 2016-03-24 — End: 2016-03-24
  Administered 2016-03-24: 14:00:00 via INTRAVENOUS

## 2016-03-24 MED ORDER — MIDAZOLAM 1 MG/ML IJ SOLN
1 mg/mL | INTRAMUSCULAR | Status: AC
Start: 2016-03-24 — End: ?

## 2016-03-24 MED ORDER — ONDANSETRON (PF) 4 MG/2 ML INJECTION
4 mg/2 mL | INTRAMUSCULAR | Status: AC
Start: 2016-03-24 — End: ?

## 2016-03-24 MED ORDER — GLYCOPYRROLATE 0.2 MG/ML IJ SOLN
0.2 mg/mL | INTRAMUSCULAR | Status: DC | PRN
Start: 2016-03-24 — End: 2016-03-24
  Administered 2016-03-24: 16:00:00 via INTRAVENOUS

## 2016-03-24 MED ORDER — LIDOCAINE (PF) 20 MG/ML (2 %) IJ SOLN
20 mg/mL (2 %) | INTRAMUSCULAR | Status: DC | PRN
Start: 2016-03-24 — End: 2016-03-24
  Administered 2016-03-24: 14:00:00 via INTRAVENOUS

## 2016-03-24 MED ORDER — PHENYLEPHRINE (PF) 1 MG/10 ML (100 MCG/ML) IN NS IV SYRINGE
1 mg/0 mL (00 mcg/mL) | INTRAVENOUS | Status: AC
Start: 2016-03-24 — End: ?

## 2016-03-24 MED ORDER — LACTATED RINGERS IV
INTRAVENOUS | Status: DC
Start: 2016-03-24 — End: 2016-03-24

## 2016-03-24 MED ORDER — PROPOFOL 10 MG/ML IV EMUL
10 mg/mL | INTRAVENOUS | Status: DC | PRN
Start: 2016-03-24 — End: 2016-03-24
  Administered 2016-03-24: 14:00:00 via INTRAVENOUS

## 2016-03-24 MED ORDER — SODIUM CHLORIDE 0.9 % INJECTION
202 mg/2 mL | Freq: Two times a day (BID) | INTRAMUSCULAR | Status: DC
Start: 2016-03-24 — End: 2016-03-26
  Administered 2016-03-25 – 2016-03-26 (×4): via INTRAVENOUS

## 2016-03-24 MED ORDER — ENOXAPARIN 40 MG/0.4 ML SUB-Q SYRINGE
40 mg/0.4 mL | Freq: Two times a day (BID) | SUBCUTANEOUS | Status: AC
Start: 2016-03-24 — End: 2016-03-26
  Administered 2016-03-25 – 2016-03-26 (×4): via SUBCUTANEOUS

## 2016-03-24 MED ORDER — GLYCOPYRROLATE 0.2 MG/ML IJ SOLN
0.2 mg/mL | INTRAMUSCULAR | Status: AC
Start: 2016-03-24 — End: ?

## 2016-03-24 MED ORDER — METOPROLOL TARTRATE 5 MG/5 ML IV SOLN
5 mg/ mL | INTRAVENOUS | Status: AC
Start: 2016-03-24 — End: ?

## 2016-03-24 MED ORDER — BUPIVACAINE LIPOSOME (PF) 266 MG/20 ML (13.3 MG/ML) SUSP, INFILTRATION
1.3 % (13.3 mg/mL) | Status: AC
Start: 2016-03-24 — End: ?

## 2016-03-24 MED ORDER — ONDANSETRON (PF) 4 MG/2 ML INJECTION
4 mg/2 mL | Freq: Four times a day (QID) | INTRAMUSCULAR | Status: AC
Start: 2016-03-24 — End: 2016-03-25
  Administered 2016-03-24 – 2016-03-25 (×4): via INTRAVENOUS

## 2016-03-24 MED ORDER — CEFOTETAN 2 GRAM SOLUTION FOR INJECTION
2 gram | Freq: Once | INTRAMUSCULAR | Status: DC
Start: 2016-03-24 — End: 2016-03-24

## 2016-03-24 MED ORDER — EPHEDRINE (PF) 50 MG/10 ML (5 MG/ML) IN NS IV SYRINGE
50 mg/10 mL (5 mg/mL) | INTRAVENOUS | Status: AC
Start: 2016-03-24 — End: ?

## 2016-03-24 MED FILL — INSULIN LISPRO 100 UNIT/ML INJECTION: 100 unit/mL | SUBCUTANEOUS | Qty: 1

## 2016-03-24 MED FILL — SODIUM CHLORIDE 0.9 % IV: INTRAVENOUS | Qty: 50

## 2016-03-24 MED FILL — ANECTINE 20 MG/ML INJECTION SOLUTION: 20 mg/mL | INTRAMUSCULAR | Qty: 10

## 2016-03-24 MED FILL — ONDANSETRON (PF) 4 MG/2 ML INJECTION: 4 mg/2 mL | INTRAMUSCULAR | Qty: 2

## 2016-03-24 MED FILL — LABETALOL 5 MG/ML IV SYRINGE: 20 mg/4 mL (5 mg/mL) | INTRAVENOUS | Qty: 4

## 2016-03-24 MED FILL — EXPAREL (PF) 1.3 % (13.3 MG/ML) SUSPENSION FOR LOCAL INFILTRATION: 1.3 % (13.3 mg/mL) | Qty: 20

## 2016-03-24 MED FILL — MIDAZOLAM 1 MG/ML IJ SOLN: 1 mg/mL | INTRAMUSCULAR | Qty: 2

## 2016-03-24 MED FILL — PROMETHAZINE 25 MG/ML INJECTION: 25 mg/mL | INTRAMUSCULAR | Qty: 0.5

## 2016-03-24 MED FILL — PHENYLEPHRINE (PF) 1 MG/10 ML (100 MCG/ML) IN NS IV SYRINGE: 1 mg/0 mL (00 mcg/mL) | INTRAVENOUS | Qty: 10

## 2016-03-24 MED FILL — GLYCOPYRROLATE 0.2 MG/ML IJ SOLN: 0.2 mg/mL | INTRAMUSCULAR | Qty: 4

## 2016-03-24 MED FILL — SOLU-CORTEF ACT-O-VIAL (PF) 100 MG/2 ML SOLUTION FOR INJECTION: 100 mg/2 mL | INTRAMUSCULAR | Qty: 2

## 2016-03-24 MED FILL — PROMETHAZINE 25 MG/ML INJECTION: 25 mg/mL | INTRAMUSCULAR | Qty: 1

## 2016-03-24 MED FILL — DILAUDID (PF) 2 MG/ML INJECTION SOLUTION: 2 mg/mL | INTRAMUSCULAR | Qty: 1

## 2016-03-24 MED FILL — METOPROLOL TARTRATE 5 MG/5 ML IV SOLN: 5 mg/ mL | INTRAVENOUS | Qty: 5

## 2016-03-24 MED FILL — LACTATED RINGERS IV: INTRAVENOUS | Qty: 1000

## 2016-03-24 MED FILL — BD POSIFLUSH NORMAL SALINE 0.9 % INJECTION SYRINGE: INTRAMUSCULAR | Qty: 10

## 2016-03-24 MED FILL — HEPARIN (PORCINE) 5,000 UNIT/ML IJ SOLN: 5000 unit/mL | INTRAMUSCULAR | Qty: 1

## 2016-03-24 MED FILL — CEFOTETAN 2 GRAM SOLUTION FOR INJECTION: 2 gram | INTRAMUSCULAR | Qty: 2

## 2016-03-24 MED FILL — ROCURONIUM 10 MG/ML IV: 10 mg/mL | INTRAVENOUS | Qty: 5

## 2016-03-24 MED FILL — EPHEDRINE (PF) 50 MG/10 ML (5 MG/ML) IN NS IV SYRINGE: 50 mg/10 mL (5 mg/mL) | INTRAVENOUS | Qty: 10

## 2016-03-24 MED FILL — ACETAMINOPHEN 650 MG RECTAL SUPPOSITORY: 650 mg | RECTAL | Qty: 1

## 2016-03-24 MED FILL — SCOPOLAMINE (1.3-1.5) MG 72 HR TRANSDERM PATCH: 1 mg over 3 days | TRANSDERMAL | Qty: 1

## 2016-03-24 MED FILL — SODIUM CHLORIDE 0.9 % INJECTION: INTRAMUSCULAR | Qty: 10

## 2016-03-24 MED FILL — FAMOTIDINE (PF) 20 MG/2 ML IV: 20 mg/2 mL | INTRAVENOUS | Qty: 2

## 2016-03-24 MED FILL — PROPOFOL 10 MG/ML IV EMUL: 10 mg/mL | INTRAVENOUS | Qty: 20

## 2016-03-24 MED FILL — FENTANYL CITRATE (PF) 50 MCG/ML IJ SOLN: 50 mcg/mL | INTRAMUSCULAR | Qty: 5

## 2016-03-24 NOTE — Interval H&P Note (Signed)
H&P Update:  Lacey Ward was seen and examined.  History and physical has been reviewed. The patient has been examined. There have been no significant clinical changes since the completion of the originally dated History and Physical.   Lacey Ward is a Jehovah Witness and will accept no blood transfusion.  I will us buttressing maternal to minimize the risk of postoperative bleeding.    Signed By: Valera Castleobert J Taneshia Lorence, MD     March 24, 2016 7:33 AM

## 2016-03-24 NOTE — Other (Signed)
Bedside and Verbal shift change report given to RN Jesusita Okaan (oncoming nurse) by Delynn Flavinatherine E Williams, RN (offgoing nurse). Report included the following information SBAR, Kardex, MAR and Recent Results.    SITUATION:  Code Status: Full Code  Reason for Admission: E66.01 MORBID OBESITY; I10 HYPERTENTION; K21.9 GERD; N39.3 USI; E11.9 DIABETES; E88.81 METABOLIC SYNDROME; E78.4 HYPERLIPIDEMIA; G47.33 OSA  Morbid obesity due to excess calories (HCC)  Diabetes mellitus, type 2 (HCC)  Hypertension  OSA (obstructive sleep apnea)  Metabolic syndrome  GERD (gastroesophageal reflux disease)  Urinary, incontinence, stress female  Chronic lower back pain  Hospital day: 0  Problem List:       Hospital Problems  Date Reviewed: 03/24/2016          Codes Class Noted POA    GERD (gastroesophageal reflux disease) ICD-10-CM: K21.9  ICD-9-CM: 530.81  03/24/2016 Unknown        Diabetes mellitus, type 2 (HCC) ICD-10-CM: E11.9  ICD-9-CM: 250.00  03/24/2016 Unknown        Hypertension ICD-10-CM: I10  ICD-9-CM: 401.9  03/24/2016 Unknown        Urinary, incontinence, stress female ICD-10-CM: N39.3  ICD-9-CM: 625.6  03/24/2016 Unknown        OSA (obstructive sleep apnea) ICD-10-CM: G47.33  ICD-9-CM: 327.23  03/24/2016 Unknown        Metabolic syndrome ICD-10-CM: E88.81  ICD-9-CM: 277.7  03/24/2016 Unknown        Chronic lower back pain ICD-10-CM: M54.5, G89.29  ICD-9-CM: 724.2, 338.29  03/24/2016 Unknown        Morbid obesity due to excess calories (HCC) ICD-10-CM: E66.01  ICD-9-CM: 278.01  03/24/2016 Unknown              BACKGROUND:   Past Medical History:   Past Medical History:   Diagnosis Date   ??? Autoimmune disease (HCC)    ??? Diabetes (HCC)     no meds   ??? GERD (gastroesophageal reflux disease)    ??? Hypertension    ??? Nausea & vomiting       Patient taking anticoagulants yes   Patient has a defibrillator: no    History of shots YES for example, flu, pneumonia, tetanus   Isolation History NO for example, MRSA, CDiff    ASSESSMENT:   Changes in Assessment Throughout Shift: new admit - continued nausea   Significant Changes in 24 hours (for example, RR/code, fall)  Patient has Central Line: no Reasons if yes:   Patient has Foley Cath: yes Reasons if yes: surgical    Mobility Issues  PT  IV Patency  OR Checklist  Pending Tests    Last Vitals:  Vitals w/ MEWS Score (last day)     Date/Time MEWS Score Pulse Resp Temp BP Level of Consciousness SpO2    03/24/16 1907 2 (!)  104 15 98.1 ??F (36.7 ??C) 157/88 Alert 95 %    03/24/16 1600 2 (!)  107 16 97.7 ??F (36.5 ??C) 149/89 Alert 93 %    03/24/16 1443 -- -- -- 98.4 ??F (36.9 ??C) -- -- --    03/24/16 1434 -- 95 15 -- 143/87 -- 96 %    03/24/16 1432 -- 95 16 -- -- -- 97 %    03/24/16 1425 -- 90 14 -- 147/87 -- 96 %    03/24/16 1422 -- 95 15 -- -- -- 96 %    03/24/16 1415 -- (!)  102 16 -- -- -- 94 %    03/24/16 1414 -- Marland Kitchen(!)  102 15 -- 169/88 -- 94 %    03/24/16 1404 -- (!)  112 17 -- 165/79 -- 95 %    03/24/16 1354 -- (!)  121 17 -- 153/77 -- 96 %    03/24/16 1344 -- (!)  110 16 -- 167/75 -- 94 %    03/24/16 1334 -- (!)  106 15 -- 162/78 -- 93 %    03/24/16 1324 -- (!)  111 15 -- 166/86 -- 93 %    03/24/16 1314 -- (!)  108 18 -- 173/85 -- 93 %    03/24/16 1256 -- (!)  104 19 -- 155/78 -- 96 %    03/24/16 1246 -- (!)  104 18 -- 156/77 -- 95 %    03/24/16 1236 -- (!)  105 20 -- 157/75 -- 93 %    03/24/16 1231 -- -- -- -- -- Alert --    03/24/16 1226 -- (!)  116 12 -- 158/77 -- 98 %    03/24/16 1216 -- (!)  102 17 -- 150/80 -- 93 %    03/24/16 1206 -- (!)  103 21 -- 148/76 -- 93 %    03/24/16 1201 -- (!)  105 23 97.9 ??F (36.6 ??C) 151/80 -- 94 %    03/24/16 1157 -- (!)  113 22 97.9 ??F (36.6 ??C) 151/80 -- 91 %    03/24/16 0806 -- 91 16 97.6 ??F (36.4 ??C) 156/87 -- 98 %            PAIN    Pain Assessment    Pain Intensity 1: 3 (03/24/16 1433)    Pain Location 1: Abdomen    Pain Intervention(s) 1: Medication (see MAR)    Patient Stated Pain Goal: 3  Intervention effective: yes   Time of last intervention:1408  Reassessment Completed: yes   Other actions taken for pain: distraction    Last 3 Weights:  Last 3 Recorded Weights in this Encounter    03/05/16 0939 03/24/16 0806   Weight: 135.2 kg (298 lb) 129.3 kg (285 lb)   Weight change:     INTAKE/OUPUT    Current Shift:      Last three shifts: 07/11 0701 - 07/12 1900  In: 1550 [I.V.:1550]  Out: 500 [Urine:450]    RECOMMENDATIONS AND DISCHARGE PLANNING  Patient needs and requests: nausea control    Pending tests/procedures: AM swallow test and am labs       Discharge plan for patient: HOME    Discharge planning Needs or Barriers: TBD    Estimated Discharge Date: 03/25/2016 Posted on Whiteboard in Patient???s Room: yes       "HEALS" SAFETY CHECK  A safety check occurred in the patient's room between off going nurse and oncoming nurse listed above.    The safety check included the below items:    H  High Alert Medications Verify all high alert medication drips (heparin, PCA, etc.)  E  Equipment Suction is set up for ALL patients (with yanker)  Red plugs utilized for all equipment (IV pumps, etc.)  WOW???s wiped down at end of shift.  Room stocked with oxygen, suction, and other unit-specific supplies  A  Alarms Bed alarm is set for fall risk patients  Ensure chair alarm is in place and activated if patient is up in a chair  L  Lines Check IV for any infiltration  Foley bag is empty if patient has a Foley   Tubing and IV bags are labeled  S  Safety  Room is clean, patient is clean, and equipment is clean.  Hallways are clear from equipment besides carts.   Fall bracelet on for fall risk patients  Ensure room is clear and free of clutter  Suction is set up for ALL patients (with yanker)  Hallways are clear from equipment besides carts.   Isolation precautions followed, supplies available outside room, sign posted    Julianne Rice, RN

## 2016-03-24 NOTE — Progress Notes (Signed)
No blood products!

## 2016-03-24 NOTE — Op Note (Signed)
[facility]  OPERATIVE REPORTS    Name:  YOULANDA, TOMASSETTI  MR#:  [id number]  DOB:  12-05-1970  Account #:  [id number]  Date of Adm:  03/24/2016  Date of Surgery:  03/24/2016      PREOPERATIVE DIAGNOSIS: Morbid obesity secondary to excess  food calories, body mass index 49.4.    COMORBIDITIES:  1. Diabetes (new diagnosis. Hemoglobin A1c 6.9).  2. Hypertension.  3. Obstructive sleep apnea.  4. Metabolic syndrome.  5. Gastroesophageal reflux disease.  6. Urinary stress incontinence.  7. Lower back pain.    POSTOPERATIVE DIAGNOSIS: Morbid obesity secondary to excess  food calories, body mass index 49.4.    COMORBIDITIES:  1. Diabetes (new diagnosis. Hemoglobin A1c 6.9).  2. Hypertension.  3. Obstructive sleep apnea.  4. Metabolic syndrome.  5. Gastroesophageal reflux disease.  6. Urinary stress incontinence.  7. Lower back pain.    PROCEDURES PERFORMED:  1. Laparoscopic sleeve gastrectomy constructed over 32 French GiViSi  tube.  2. Intraoperative pneumatic leak testing.    ESTIMATED BLOOD LOSS:  50 mL.    SPECIMENS REMOVED:  Portion of stomach.    ANESTHESIA:  General endotracheal.    INTRAOPERATIVE FINDINGS: No hiatal hernia, normal appearing  liver, leak test negative.    INDICATIONS FOR PROCEDURE: The patient is a very pleasant 45-  year-old woman with a preoperative weight of 292.2 pounds and a  body mass index of 49.4.  She desires surgical assistance for  permanent weight loss and help in abating or resolving her multiple  weight related comorbidities I have listed above.    DESCRIPTION OF OPERATION: After consent was obtained, the  patient was brought to the main operating room in sequential  compression devices applied to lower extremities and activated.  She  had been given 2 grams of Cefotetan and IV antibiotics as well as  5,000 units of heparin subcutaneously. In the OR Foley catheter was  placed and 650 mg of rectal Tylenol administered. A 36 French GiViSi   tube was inserted by anesthesia into her stomach. Her abdomen was  then prepped and draped in the usual sterile fashion. Surgical time out  was completed. A small amount of Exparel long acting liposomal  Marcaine was infiltrated in the left subcostal space. A stab incision was  made and a Veress needle was inserted.  Pneumoperitoneum to 15  mmHg was created. The Veress needle was removed and a 5 mm  cutting trocar was inserted through this. A 5 mm 45 degree  laparoscope was passed.  Initial inspection showed no evidence of  insertion trauma. Under direct visualization additional bladeless trocars  were inserted.  The Exparel was infiltrated at the skin and fascial levels  prior to all trocar placements. Just above the umbilicus a 12 mm trocar  was placed, in the right lateral abdomen a 15 mm trocar was placed.  We then brought the scope back to this position and looking back  placed one additional 5 mm trocar halfway between the first 5 and the  12 mm trocar. We then placed her in reverse Trendelenburg position  and via stab incision in the epigastrium, a medium sized Nathanson  liver retractor was inserted in the abdomen and put underneath the left  lateral lobe of the liver. This was put on anterior stretch.  Inspection  showed no evidence of a significant hiatal hernia. The liver appeared  normal.    Next, a 5 mm Enseal bipolar device was used to  take down the short  gastrics starting 2/3 of the way along the greater curvature of the  stomach. We headed towards the pylorus initially and took down those  short gastrics as well, coming to within 2 or 3 cm of the pylorus.  Care  was taken not to damage the head of the pancreas posteriorly. We  then used the articulating feature of this device and took down the  short gastrics heading up around the spleen and to the hiatus. The  peritoneum overlying the distal esophagus was opened up.  A small  Bell's esophagogastric anterior fat pad was removed. We then cleared   off all the fundus posteriorly such that the left crus could be seen in its  entirety.  There were no significant retrogastric adhesions to the  pancreas. We then asked the anesthesiologist to further advance the  6936 French GiViSi tube and it was manipulated such that it lay along the  lesser curvature of the stomach, was put on suction and initial  partitioning was started from the 15 mm trocar site using a black load  with Peri-strips attached.  The initial transection started about 3 cm  away from the pylorus. Second firing was a green load, again with the  Peri-strips attached and the articulating feature of the stapler was used  to make a gentle curve going around the incisura angularis to come to  closer than 2 inches to that site. All additional firings were gold loads  using Peri-strips attached and for these firings, the stapler hugged the  GiViSi tube as it lay along the lesser curvature. My assistant at this  point was putting perpendicular traction on the greater curvature  portion of the stomach. Several more firings of these gold loads with  Peri-strips attached were used to completely partition the stomach.  The resected portion of the stomach was pulled down into the pelvis.  The patient was placed in a true head-down Trendelenberg position. Saline  was introduced into the abdominal cavity and the anesthesiologist now  insufflated the GiViSi tube with the supplied bulb such that the gauge  was now in the green mark. With excellent distention, no air bubble  leaks could be seen. The GiViSi tube was vented to ambient air and  removed completely. The intraperitoneal fluid was suctioned out. The  patient was returned to steep reverse Trendelenburg position.  Inspection showed all was well with no significant bleeding. The liver  retractor was removed. The patient was flattened and the resected  portion of the stomach was removed via the 15 mm trocar site. Gentle   stretching of the fascia was needed for complete extraction. The  laparoscopic suturing device with 0 Vicryl suture was used to close the  fascial defect beneath the 15 and 12 mm trocar sites. There was a  small amount of bleeding from the 12 mm trocar site after the  laparoscopic suturing device was used to place the suture. However,  upon tying the sutures all bleeding stopped. Pneumoperitoneum was  released by the remaining two 5 mm trocar sites. They too were  removed. All subcutaneous tissues were irrigated with saline. All skin  incisions closed with 4-0 Monocryl suture. Dermabond was used to  dress the incision. She was awakened from anesthesia, extubated and  brought to the recovery room in excellent condition with no known  complications. Blood loss was felt to be about 50 mL. She received  1550 mL of IV Crystalloid. Urine output was 450 by  the Foley catheter  which remained in at the end of the case. It will be removed in the  recovery room.        Valera Castle, MD    RC / SM  D:  03/24/2016   12:06  T:  03/24/2016   15:43  Job #:  [id number]    Dr. Rayvon Char, [facility] physician, [address], [place].

## 2016-03-24 NOTE — Brief Op Note (Signed)
BRIEF OPERATIVE NOTE    Date of Procedure: 03/24/2016   Preoperative Diagnosis: E66.01 MORBID OBESITY, secondary to excess calories BMI 49.4; I10 HYPERTENTION; K21.9 GERD; N39.3 USI; E11.9 DIABETES; E88.81 METABOLIC SYNDROME; E78.4 HYPERLIPIDEMIA; G47.33 OSA  Postoperative Diagnosis: E66.01 MORBID OBESIT, secondary to excess calories BMI 49.4Y; I10 HYPERTENTION; K21.9 GERD; N39.3 USI; E11.9 DIABETES; E88.81 METABOLIC SYNDROME; E78.4 HYPERLIPIDEMIA; G47.33 OSA    Procedure(s):  LAPAROSCOPIC SLEEVE GASTRECTOMY constructed over 36 Fr ViSI gi tube  INTRAOPERATIVE  LEAK TESTING  Surgeon(s) and Role:     * Valera Castleobert J Caulder Wehner, MD - Primary         Assistant Staff:       Surgical Staff:  Circ-1: Terri SkainsEllie H Carawan, RN  Scrub Tech-1: Kpezoudema Amah  Scrub RN-1: Casey Burkittammy M Robinson, RN  Surg Asst-1: Tonita Phoenixanny L McDonell  Surg Asst-2: Lu DuffelVionna N Kelly  Event Time In   Incision Start 1012   Incision Close 1141     Anesthesia: General   Estimated Blood Loss: 50 ML  1550 ML IV FLUIDS  450 Ml uo VIA fOLEY  Specimens:   ID Type Source Tests Collected by Time Destination   1 : portion of stomach Preservative Stomach  Valera Castleobert J Khiana Camino, MD 03/24/2016 1020 Pathology      Findings: NO Hiatal hernia, leak test negative, liver appeared normal.   Complications: none  Implants:   Implant Name Type Inv. Item Serial No. Manufacturer Lot No. LRB No. Used Action   CARTRIDGE STPL VERITAS 60ECH -- PERI-STRIPS DRY - [id number]  CARTRIDGE STPL VERITAS 60ECH -- PERI-STRIPS DRY  [employer] [id number] N/A 6 Implanted   CARTRIDGE STPL VERITAS 60ECH -- PERI-STRIPS DRY - [id number]   CARTRIDGE STPL VERITAS 60ECH -- PERI-STRIPS DRY   [employer] [id number] N/A 1 Implanted     OP note    [id number]

## 2016-03-24 NOTE — Progress Notes (Signed)
New patient arrived from PACU in stable condition.  Pt is AOx4 and very drowsy, but easily responses to questions. Pt on 3L of O2 on NC. Respirations within normal limits. As reported by PACU, pt continues to be tachycardic. Dr. Nickie Retorthastanet at bedside and is aware of continued tachycardia. Pt c/o nausea and has had numerous dry heavies. HOB raised and vomit bag provided.  Pt has already been medication w/ phenergan, zofran and scop patch. No additional anti-nausea medications available and Dr. Nickie Retorthastanet and Marisue IvanLiz informed of continued nausea w/ medications. Due to pt's level of drowsiness and nausea, Dr. Nickie Retorthastanet instructed that the pt's foley could be removed at mid-night. Foley patent w/ clear yellow urine. Lung sounds clear w/ bilaterally equal chest expansion.  Bowel sounds present w/ no tenderness or pain w/ palpitating.   Peripheral pulses present w/ cap refill of < 3 sec, warm to touch, sensation present.  The 5 abdominal lap sites are clean dry and intact w/ dermal glue. Minor bruising around lap incisions.      No signs of distress w/ family at bedside. Pt provided education call light and bed lowest position.

## 2016-03-24 NOTE — Other (Signed)
TRANSFER - OUT REPORT:    Verbal report given to K. Williams RN on Sprint Nextel CorporationSerena Wandersee  being transferred to 5S for routine post - op       Report consisted of patient???s Situation, Background, Assessment and   Recommendations(SBAR).     Information from the following report(s) SBAR and MAR was reviewed with the receiving nurse.    Lines:   Peripheral IV 03/24/16 Left Arm (Active)   Site Assessment Clean, dry, & intact 03/24/2016 11:57 AM   Phlebitis Assessment 0 03/24/2016 11:57 AM   Infiltration Assessment 0 03/24/2016 11:57 AM   Dressing Status Clean, dry, & intact 03/24/2016 11:57 AM   Dressing Type Transparent 03/24/2016 11:57 AM   Hub Color/Line Status Pink;Infusing;Patent 03/24/2016 11:57 AM        Opportunity for questions and clarification was provided.      Patient transported with:   O2 @ 3 liters   Tech

## 2016-03-24 NOTE — Anesthesia Post-Procedure Evaluation (Signed)
Post-Anesthesia Evaluation and Assessment    Patient: Lacey Ward MRN: [id number]  SSN: [id number]    Date of Birth: 10/09/1970  Age: 45 y.o.  Sex: female       Cardiovascular Function/Vital Signs  Visit Vitals   ??? BP 143/87   ??? Pulse 95   ??? Temp 36.9 ??C (98.4 ??F)   ??? Resp 15   ??? Ht 5\' 4"  (1.626 m)   ??? Wt 129.3 kg (285 lb)   ??? SpO2 96%   ??? BMI 48.92 kg/m2       Patient is status post general anesthesia for Procedure(s):  LAPAROSCOPIC POSSIBLE OPEN SLEEVE GASTRECTOMY/POSSIBLE HIATAL HERNIA REPAIR/INTRAOPERATIVE ENDOSCOPY AND LEAK TESTING/POSSIBLE LIVER BIOPSY.    Nausea/Vomiting: None    Postoperative hydration reviewed and adequate.    Pain:  Pain Scale 1: Numeric (0 - 10) (03/24/16 1433)  Pain Intensity 1: 3 (03/24/16 1433)   Managed    Neurological Status:   Neuro (WDL): Within Defined Limits (03/24/16 1157)   At baseline    Mental Status and Level of Consciousness: Arousable    Pulmonary Status:   O2 Device: Nasal cannula (03/24/16 1201)   Adequate oxygenation and airway patent    Complications related to anesthesia: None    Post-anesthesia assessment completed. No concerns    Signed By: Guss BundeXiaoping Stephanee Barcomb, MD     March 24, 2016

## 2016-03-24 NOTE — Anesthesia Pre-Procedure Evaluation (Signed)
Anesthetic History     PONV          Review of Systems / Medical History  Patient summary reviewed, nursing notes reviewed and pertinent labs reviewed    Pulmonary        Sleep apnea: CPAP           Neuro/Psych              Cardiovascular    Hypertension: well controlled              Exercise tolerance: >4 METS     GI/Hepatic/Renal     GERD: well controlled           Endo/Other    Diabetes: well controlled, type 2    Morbid obesity     Other Findings   Comments: Risk Factors for Postoperative nausea/vomiting:       History of postoperative nausea/vomiting?  YES       Female?  YES       Motion sickness?  NO       Intended opioid administration for postoperative analgesia?  YES      Smoking Abstinence  Current Smoker?   NO  Elective Surgery? YES  Seen preoperatively by anesthesiologist or proxy prior to day of surgery?  YES  Pt abstained from smoking 24 hours prior to anesthesia? N/A           Physical Exam    Airway  Mallampati: IV  TM Distance: 4 - 6 cm  Neck ROM: short neck   Mouth opening: Diminished (comment)     Cardiovascular  Regular rate and rhythm,  S1 and S2 normal,  no murmur, click, rub, or gallop             Dental  No notable dental hx       Pulmonary  Breath sounds clear to auscultation               Abdominal  GI exam deferred       Other Findings            Anesthetic Plan    ASA: 3  Anesthesia type: general          Induction: Intravenous  Anesthetic plan and risks discussed with: Patient

## 2016-03-24 NOTE — Op Note (Signed)
[facility]  OPERATIVE REPORTS    Name:  Dahlia ByesKOLTZAU, Naeemah  MR#:  [id number]  DOB:  20-Apr-1971  Account #:  [id number]  Date of Adm:  03/24/2016  Date of Surgery:  03/24/2016      PREOPERATIVE DIAGNOSIS: Morbid obesity secondary to excess  food calories, body mass index 49.4.    COMORBIDITIES:  1. Diabetes (new diagnosis. Hemoglobin A1c 6.9).  2. Hypertension.  3. Obstructive sleep apnea.  4. Metabolic syndrome.  5. Gastroesophageal reflux disease.  6. Urinary stress incontinence.  7. Lower back pain.    POSTOPERATIVE DIAGNOSIS: Morbid obesity secondary to excess  food calories, body mass index 49.4.    COMORBIDITIES:  1. Diabetes (new diagnosis. Hemoglobin A1c 6.9).  2. Hypertension.  3. Obstructive sleep apnea.  4. Metabolic syndrome.  5. Gastroesophageal reflux disease.  6. Urinary stress incontinence.  7. Lower back pain.    PROCEDURES PERFORMED:  1. Laparoscopic sleeve gastrectomy constructed over 6136 French GiViSi  tube.  2. Intraoperative pneumatic leak testing.    ESTIMATED BLOOD LOSS:  50 mL.    SPECIMENS REMOVED:  Portion of stomach.    ANESTHESIA:  General endotracheal.    INTRAOPERATIVE FINDINGS: No hiatal hernia, normal appearing  liver, leak test negative.    INDICATIONS FOR PROCEDURE: The patient is a very pleasant 45-  year-old woman with a preoperative weight of 292.2 pounds and a  body mass index of 49.4.  She desires surgical assistance for  permanent weight loss and help in abating or resolving her multiple  weight related comorbidities I have listed above.    DESCRIPTION OF OPERATION: After consent was obtained, the  patient was brought to the main operating room in sequential  compression devices applied to lower extremities and activated.  She  had been given 2 grams of Cefotetan and IV antibiotics as well as  5,000 units of heparin subcutaneously. In the OR Foley catheter was  placed and 650 mg of rectal Tylenol administered. A 36 French GiViSi  tube was inserted by  anesthesia into her stomach. Her abdomen was  then prepped and draped in the usual sterile fashion. Surgical time out  was completed. A small amount of Exparel long acting liposomal  Marcaine was infiltrated in the left subcostal space. A stab incision was  made and a Veress needle was inserted.  Pneumoperitoneum to 15  mmHg was created. The Veress needle was removed and a 5 mm  cutting trocar was inserted through this. A 5 mm 45 degree  laparoscope was passed.  Initial inspection showed no evidence of  insertion trauma. Under direct visualization additional bladeless trocars  were inserted.  The Exparel was infiltrated at the skin and fascial levels  prior to all trocar placements. Just above the umbilicus a 12 mm trocar  was placed, in the right lateral abdomen a 15 mm trocar was placed.  We then brought the scope back to this position and looking back  placed one additional 5 mm trocar halfway between the first 5 and the  12 mm trocar. We then placed her in reverse Trendelenburg position  and via stab incision in the epigastrium, a medium sized Nathanson  liver retractor was inserted in the abdomen and put underneath the left  lateral lobe of the liver. This was put on anterior stretch.  Inspection  showed no evidence of a significant hiatal hernia. The liver appeared  normal.    Next, a 5 mm Enseal bipolar device was used to  take down the short  gastrics starting 2/3 of the way along the greater curvature of the  stomach. We headed towards the pylorus initially and took down those  short gastrics as well, coming to within 2 or 3 cm of the pylorus.  Care  was taken not to damage the head of the pancreas posteriorly. We  then used the articulating feature of this device and took down the  short gastrics heading up around the spleen and to the hiatus. The  peritoneum overlying the distal esophagus was opened up.  A small  Bell's esophagogastric anterior fat pad was removed. We then cleared  off all the fundus  posteriorly such that the left crus could be seen in its  entirety.  There were no significant retrogastric adhesions to the  pancreas. We then asked the anesthesiologist to further advance the  65 French GiViSi tube and it was manipulated such that it lay along the  lesser curvature of the stomach, was put on suction and initial  partitioning was started from the 15 mm trocar site using a black load  with Peri-strips attached.  The initial transection started about 3 cm  away from the pylorus. Second firing was a green load, again with the  Peri-strips attached and the articulating feature of the stapler was used  to make a gentle curve going around the incisura angularis to come to  closer than 2 inches to that site. All additional firings were gold loads  using Peri-strips attached and for these firings, the stapler hugged the  GiViSi tube as it lay along the lesser curvature. My assistant at this  point was putting perpendicular traction on the greater curvature  portion of the stomach. Several more firings of these gold loads with  Peri-strips attached were used to completely partition the stomach.  The resected portion of the stomach was pulled down into the pelvis.  The patient was placed in a true head-down Trendelenberg position. Saline  was introduced into the abdominal cavity and the anesthesiologist now  insufflated the GiViSi tube with the supplied bulb such that the gauge  was now in the green mark. With excellent distention, no air bubble  leaks could be seen. The GiViSi tube was vented to ambient air and  removed completely. The intraperitoneal fluid was suctioned out. The  patient was returned to steep reverse Trendelenburg position.  Inspection showed all was well with no significant bleeding. The liver  retractor was removed. The patient was flattened and the resected  portion of the stomach was removed via the 15 mm trocar site. Gentle  stretching of the fascia was needed for complete extraction.  The  laparoscopic suturing device with 0 Vicryl suture was used to close the  fascial defect beneath the 15 and 12 mm trocar sites. There was a  small amount of bleeding from the 12 mm trocar site after the  laparoscopic suturing device was used to place the suture. However,  upon tying the sutures all bleeding stopped. Pneumoperitoneum was  released by the remaining two 5 mm trocar sites. They too were  removed. All subcutaneous tissues were irrigated with saline. All skin  incisions closed with 4-0 Monocryl suture. Dermabond was used to  dress the incision. She was awakened from anesthesia, extubated and  brought to the recovery room in excellent condition with no known  complications. Blood loss was felt to be about 50 mL. She received  1550 mL of IV Crystalloid. Urine output was 450 by  the Foley catheter  which remained in at the end of the case. It will be removed in the  recovery room.        Valera Castle, MD    RC / SM  D:  03/24/2016   12:06  T:  03/24/2016   15:43  Job #:  [id number]    Dr. Rayvon Char, [facility] physician, [address], [place].

## 2016-03-25 ENCOUNTER — Inpatient Hospital Stay: Admit: 2016-03-25 | Payer: BLUE CROSS/BLUE SHIELD | Primary: Family Medicine

## 2016-03-25 LAB — RENAL FUNCTION PANEL
Albumin: 3.1 g/dL — ABNORMAL LOW (ref 3.4–5.0)
Anion gap: 8 mmol/L (ref 3.0–18)
BUN/Creatinine ratio: 6 — ABNORMAL LOW (ref 12–20)
BUN: 4 MG/DL — ABNORMAL LOW (ref 7.0–18)
CO2: 26 mmol/L (ref 21–32)
Calcium: 8.4 MG/DL — ABNORMAL LOW (ref 8.5–10.1)
Chloride: 102 mmol/L (ref 100–108)
Creatinine: 0.62 MG/DL (ref 0.6–1.3)
GFR est AA: >60 mL/min/{1.73_m2} (ref >60)
GFR est non-AA: >60 mL/min/{1.73_m2} (ref >60)
Glucose: 118 mg/dL — ABNORMAL HIGH (ref 74–99)
Phosphorus: 2.8 MG/DL (ref 2.5–4.9)
Potassium: 3.5 mmol/L (ref 3.5–5.5)
Sodium: 136 mmol/L (ref 136–145)

## 2016-03-25 LAB — GLUCOSE, POC
Glucose (POC): 103 mg/dL (ref 70–110)
Glucose (POC): 125 mg/dL — ABNORMAL HIGH (ref 70–110)
Glucose (POC): 117 mg/dL — ABNORMAL HIGH (ref 70–110)
Glucose (POC): 113 mg/dL — ABNORMAL HIGH (ref 70–110)

## 2016-03-25 LAB — CBC WITH AUTOMATED DIFF
ABS. BASOPHILS: 0 10*3/uL (ref 0.0–0.1)
ABS. EOSINOPHILS: 0.1 10*3/uL (ref 0.0–0.4)
ABS. LYMPHOCYTES: 2.4 10*3/uL (ref 0.9–3.6)
ABS. MONOCYTES: 0.9 10*3/uL (ref 0.05–1.2)
ABS. NEUTROPHILS: 7.3 10*3/uL (ref 1.8–8.0)
BASOPHILS: 0 % (ref 0–2)
EOSINOPHILS: 1 % (ref 0–5)
HCT: 39.1 % (ref 35.0–45.0)
HGB: 12.5 g/dL (ref 12.0–16.0)
LYMPHOCYTES: 23 % (ref 21–52)
MCH: 28.3 PG (ref 24.0–34.0)
MCHC: 32 g/dL (ref 31.0–37.0)
MCV: 88.5 FL (ref 74.0–97.0)
MONOCYTES: 8 % (ref 3–10)
MPV: 10.2 FL (ref 9.2–11.8)
NEUTROPHILS: 68 % (ref 40–73)
PLATELET: 262 10*3/uL (ref 135–420)
RBC: 4.42 M/uL (ref 4.20–5.30)
RDW: 15.1 % — ABNORMAL HIGH (ref 11.6–14.5)
WBC: 10.7 10*3/uL (ref 4.6–13.2)

## 2016-03-25 LAB — MAGNESIUM: Magnesium: 2.1 mg/dL (ref 1.6–2.6)

## 2016-03-25 MED ORDER — DIATRIZOATE MEGLUMINE & SODIUM 66 %-10 % ORAL SOLN
66-10 % | Freq: Once | ORAL | Status: AC
Start: 2016-03-25 — End: 2016-03-25
  Administered 2016-03-25: 12:00:00 via ORAL

## 2016-03-25 MED ORDER — PREDNISONE 5 MG TAB
5 mg | Freq: Every day | ORAL | Status: DC
Start: 2016-03-25 — End: 2016-03-26
  Administered 2016-03-25 – 2016-03-26 (×2): via ORAL

## 2016-03-25 MED ORDER — AMLODIPINE 5 MG TAB
5 mg | Freq: Every day | ORAL | Status: DC
Start: 2016-03-25 — End: 2016-03-26
  Administered 2016-03-25 – 2016-03-26 (×2): via ORAL

## 2016-03-25 MED ORDER — HYDROCODONE-ACETAMINOPHEN 7.5 MG-325 MG/15 ML ORAL SOLN
ORAL | Status: DC | PRN
Start: 2016-03-25 — End: 2016-03-26
  Administered 2016-03-25: 22:00:00 via ORAL

## 2016-03-25 MED ORDER — CARVEDILOL 3.125 MG TAB
3.125 mg | Freq: Two times a day (BID) | ORAL | Status: DC
Start: 2016-03-25 — End: 2016-03-26
  Administered 2016-03-25 – 2016-03-26 (×3): via ORAL

## 2016-03-25 MED ORDER — BARIUM SULFATE 96 % (W/W) ORAL SUSP, RECON
96 % (w/w) | Freq: Once | ORAL | Status: AC
Start: 2016-03-25 — End: 2016-03-25
  Administered 2016-03-25: 12:00:00 via ORAL

## 2016-03-25 MED FILL — PROMETHAZINE 25 MG/ML INJECTION: 25 mg/mL | INTRAMUSCULAR | Qty: 0.5

## 2016-03-25 MED FILL — METOPROLOL TARTRATE 5 MG/5 ML IV SOLN: 5 mg/ mL | INTRAVENOUS | Qty: 5

## 2016-03-25 MED FILL — ROCURONIUM 10 MG/ML IV: 10 mg/mL | INTRAVENOUS | Qty: 5

## 2016-03-25 MED FILL — HYDROMORPHONE (PF) 1 MG/ML IJ SOLN: 1 mg/mL | INTRAMUSCULAR | Qty: 1

## 2016-03-25 MED FILL — PREDNISONE 5 MG TAB: 5 mg | ORAL | Qty: 1

## 2016-03-25 MED FILL — ONDANSETRON (PF) 4 MG/2 ML INJECTION: 4 mg/2 mL | INTRAMUSCULAR | Qty: 2

## 2016-03-25 MED FILL — FAMOTIDINE (PF) 20 MG/2 ML IV: 20 mg/2 mL | INTRAVENOUS | Qty: 2

## 2016-03-25 MED FILL — E-Z-PAQUE 96 % (W/W) ORAL POWDER FOR SUSPENSION: 96 % (w/w) | ORAL | Qty: 20

## 2016-03-25 MED FILL — CEFOTETAN 2 GRAM SOLUTION FOR INJECTION: 2 gram | INTRAMUSCULAR | Qty: 2

## 2016-03-25 MED FILL — PROPOFOL 10 MG/ML IV EMUL: 10 mg/mL | INTRAVENOUS | Qty: 20

## 2016-03-25 MED FILL — ENOXAPARIN 40 MG/0.4 ML SUB-Q SYRINGE: 40 mg/0.4 mL | SUBCUTANEOUS | Qty: 0.4

## 2016-03-25 MED FILL — LACTATED RINGERS IV: INTRAVENOUS | Qty: 2000

## 2016-03-25 MED FILL — AMLODIPINE 5 MG TAB: 5 mg | ORAL | Qty: 1

## 2016-03-25 MED FILL — XYLOCAINE-MPF 20 MG/ML (2 %) INJECTION SOLUTION: 20 mg/mL (2 %) | INTRAMUSCULAR | Qty: 5

## 2016-03-25 MED FILL — GLYCOPYRROLATE 0.2 MG/ML IJ SOLN: 0.2 mg/mL | INTRAMUSCULAR | Qty: 4

## 2016-03-25 MED FILL — CARVEDILOL 12.5 MG TAB: 12.5 mg | ORAL | Qty: 1

## 2016-03-25 MED FILL — HYDROCODONE-ACETAMINOPHEN 7.5 MG-325 MG/15 ML ORAL SOLN: ORAL | Qty: 15

## 2016-03-25 MED FILL — NEOSTIGMINE METHYLSULFATE 1 MG/ML INJECTION: 1 mg/mL | INTRAMUSCULAR | Qty: 3

## 2016-03-25 MED FILL — MD-GASTROVIEW 66 %-10 % ORAL SOLUTION: 66-10 % | ORAL | Qty: 30

## 2016-03-25 MED FILL — QUELICIN 20 MG/ML INJECTION SOLUTION: 20 mg/mL | INTRAMUSCULAR | Qty: 10

## 2016-03-25 NOTE — Other (Signed)
Bedside and Verbal shift change report given to Dan,RN (oncoming nurse) by Oralia Manisaniel T Kornegay, RN (offgoing nurse). Report included the following information SBAR, Kardex, MAR and Recent Results.    SITUATION:   ? Code Status: Full Code  ? Reason for Admission: E66.01 MORBID OBESITY; I10 HYPERTENTION; K21.9 GERD; N39.3 USI; E11.9 DIABETES; E88.81 METABOLIC SYNDROME; E78.4 HYPERLIPIDEMIA; G47.33 OSA  ? Morbid obesity due to excess calories (HCC)  ? Diabetes mellitus, type 2 (HCC)  ? Hypertension  ? OSA (obstructive sleep apnea)  ? Metabolic syndrome  ? GERD (gastroesophageal reflux disease)  ? Urinary, incontinence, stress female  ? Chronic lower back pain    ? Hospital day: 1  ? Problem List:       Hospital Problems  Date Reviewed: 03/24/2016          Codes Class Noted POA    GERD (gastroesophageal reflux disease) ICD-10-CM: K21.9  ICD-9-CM: 530.81  03/24/2016 Unknown        Diabetes mellitus, type 2 (HCC) ICD-10-CM: E11.9  ICD-9-CM: 250.00  03/24/2016 Unknown        Hypertension ICD-10-CM: I10  ICD-9-CM: 401.9  03/24/2016 Unknown        Urinary, incontinence, stress female ICD-10-CM: N39.3  ICD-9-CM: 625.6  03/24/2016 Unknown        OSA (obstructive sleep apnea) ICD-10-CM: G47.33  ICD-9-CM: 327.23  03/24/2016 Unknown        Metabolic syndrome ICD-10-CM: E88.81  ICD-9-CM: 277.7  03/24/2016 Unknown        Chronic lower back pain ICD-10-CM: M54.5, G89.29  ICD-9-CM: 724.2, 338.29  03/24/2016 Unknown        Morbid obesity due to excess calories (HCC) ICD-10-CM: E66.01  ICD-9-CM: 278.01  03/24/2016 Unknown              BACKGROUND:    Past Medical History:   Past Medical History:   Diagnosis Date   ??? Autoimmune disease (HCC)    ??? Diabetes (HCC)     no meds   ??? GERD (gastroesophageal reflux disease)    ??? Hypertension    ??? Nausea & vomiting          Patient taking anticoagulants yes     ASSESSMENT:   ? Changes in Assessment Throughout Shift: no    ? Patient has Central Line: no Reasons if yes: no   ? Patient has Foley Cath: no Reasons if yes: .     ? Last Vitals:     Vitals:    03/25/16 0352 03/25/16 1244 03/25/16 1428 03/25/16 1601   BP: (!) 189/106 (!) 149/94 147/82 144/80   Pulse: (!) 107 (!) 112 93 95   Resp: 16 22  16    Temp: 98.8 ??F (37.1 ??C) 96.6 ??F (35.9 ??C)  98.9 ??F (37.2 ??C)   SpO2: 91% 94%  96%   Weight:       Height:           ? IV and DRAINS (will only show if present)   Peripheral IV 03/24/16 Left Arm-Site Assessment: Clean, dry, & intact    ? WOUND (if present)   Wound Type:  abd lap sites   Dressing present Dressing Present : No   Wound Concerns/Notes:  none    ? PAIN    Pain Assessment    Pain Intensity 1: 0 (03/25/16 0056)    Pain Location 1: Abdomen    Pain Intervention(s) 1: Medication (see MAR)    Patient Stated Pain Goal: 4  o Interventions  for Pain:  See mar  o Intervention effective: yes  o Time of last intervention: see mar   o Reassessment Completed: yes     ? Last 3 Weights:  Last 3 Recorded Weights in this Encounter    03/05/16 0939 03/24/16 0806   Weight: 135.2 kg (298 lb) 129.3 kg (285 lb)     Weight change:     ? INTAKE/OUPUT    Current Shift: 07/13 0701 - 07/13 1900  In: 1668.8 [P.O.:270; I.V.:1398.8]  Out: -     Last three shifts: 07/11 1901 - 07/13 0700  In: 2335 [I.V.:2335]  Out: 4150 [Urine:4100]    ? LAB RESULTS     Recent Labs      03/25/16   0348   WBC  10.7   HGB  12.5   HCT  39.1   PLT  262        Recent Labs      03/25/16   0348   NA  136   K  3.5   GLU  118*   BUN  4*   CREA  0.62   CA  8.4*   MG  2.1       RECOMMENDATIONS AND DISCHARGE PLANNING     1. Pending tests/procedures/ Plan of Care or Other Needs: no     2. Discharge plan for patient and Needs/Barriers: home    3. Estimated Discharge Date: 7-14 Posted on Whiteboard in Patient???s Room: yes      4. The patient's care plan was reviewed with the oncoming nurse.       "HEALS" SAFETY CHECK      Fall Risk    Total Score: 3    Safety Measures: Safety Measures: Bed/Chair-Wheels locked, Bed in low  position, Call light within reach    A safety check occurred in the patient's room between off going nurse and oncoming nurse listed above.    The safety check included the below items  Area Items   H  High Alert Medications ? Verify all high alert medication drips (heparin, PCA, etc.)   E  Equipment ? Suction is set up for ALL patients (with yanker)  ? Red plugs utilized for all equipment (IV pumps, etc.)  ? WOW???s wiped down at end of shift.  ? Room stocked with oxygen, suction, and other unit-specific supplies   A  Alarms ? Bed alarm is set for fall risk patients  ? Ensure chair alarm is in place and activated if patient is up in a chair   L  Lines ? Check IV for any infiltration  ? Foley bag is empty if patient has a Foley   ? Tubing and IV bags are labeled   S  Safety   ? Room is clean, patient is clean, and equipment is clean.  ? Hallways are clear from equipment besides carts.   ? Fall bracelet on for fall risk patients  ? Ensure room is clear and free of clutter  ? Suction is set up for ALL patients (with yanker)  ? Hallways are clear from equipment besides carts.   ? Isolation precautions followed, supplies available outside room, sign posted     Oralia Manis, RN

## 2016-03-25 NOTE — Progress Notes (Addendum)
Progress Note    Patient: Lacey Ward MRN: [id number]  SSN: [id number]    Date of Birth: 26-Aug-1971  Age: 45 y.o.  Sex: female      Admit Date: 03/24/2016    LOS: 1 day  POD#1 s/p sleeve gastrectomy    Subjective:     Nausea comes and goes, a bit better this AM, no more retching.  Gets dizzy with standing,  C/o pain especially with movement, the right side the most,  Up every hour to urinate without foley.    Objective:     Vitals:    03/24/16 1600 03/24/16 1907 03/25/16 0029 03/25/16 0352   BP: 149/89 157/88 152/85 (!) 189/106   Pulse: (!) 107 (!) 104 100 (!) 107   Resp: Temp: 97.7 ??F (36.5 ??C) 98.1 ??F (36.7 ??C) 97 ??F (36.1 ??C) 98.8 ??F (37.1 ??C)   SpO2: 93% 95% 91% 91%   Weight:       Height:            Intake and Output:  Current Shift:    Last three shifts: 07/11 1901 - 07/13 0700  In: 2335 [I.V.:2335]  Out: 4150 [Urine:4100]    Physical Exam:   Looks well good color  Lungs clear  Cor RR tachy  Abdomen is soft and appropriate tenderness directly over trochar sites, the right 15 mm site the most, all are clean closed and dry  Legs no swelling    Lab/Data Review:  Recent Results (from the past 12 hour(s))   GLUCOSE, POC    Collection Time: 03/24/16  9:38 PM   Result Value Ref Range    Glucose (POC) 103 70 - 110 mg/dL   CBC WITH AUTOMATED DIFF    Collection Time: 03/25/16  3:48 AM   Result Value Ref Range    WBC 10.7 4.6 - 13.2 K/uL    RBC 4.42 4.20 - 5.30 M/uL    HGB 12.5 12.0 - 16.0 g/dL    HCT 08.6 57.8 - 46.9 %    MCV 88.5 74.0 - 97.0 FL    MCH 28.3 24.0 - 34.0 PG    MCHC 32.0 31.0 - 37.0 g/dL    RDW 62.9 (H) 52.8 - 14.5 %    PLATELET 262 135 - 420 K/uL    MPV 10.2 9.2 - 11.8 FL    NEUTROPHILS 68 40 - 73 %    LYMPHOCYTES 23 21 - 52 %    MONOCYTES 8 3 - 10 %    EOSINOPHILS 1 0 - 5 %    BASOPHILS 0 0 - 2 %    ABS. NEUTROPHILS 7.3 1.8 - 8.0 K/UL    ABS. LYMPHOCYTES 2.4 0.9 - 3.6 K/UL    ABS. MONOCYTES 0.9 0.05 - 1.2 K/UL    ABS. EOSINOPHILS 0.1 0.0 - 0.4 K/UL    ABS. BASOPHILS 0.0 0.0 - 0.1 K/UL     DF AUTOMATED     RENAL FUNCTION PANEL    Collection Time: 03/25/16  3:48 AM   Result Value Ref Range    Sodium 136 136 - 145 mmol/L    Potassium 3.5 3.5 - 5.5 mmol/L    Chloride 102 100 - 108 mmol/L    CO2 26 21 - 32 mmol/L    Anion gap 8 3.0 - 18 mmol/L    Glucose 118 (H) 74 - 99 mg/dL    BUN 4 (L) 7.0 - 18 MG/DL    Creatinine  0.62 0.6 - 1.3 MG/DL    BUN/Creatinine ratio 6 (L) 12 - 20      GFR est AA >60 >60 ml/min/1.8073m2    GFR est non-AA >60 >60 ml/min/1.5573m2    Calcium 8.4 (L) 8.5 - 10.1 MG/DL    Phosphorus 2.8 2.5 - 4.9 MG/DL    Albumin 3.1 (L) 3.4 - 5.0 g/dL   MAGNESIUM    Collection Time: 03/25/16  3:48 AM   Result Value Ref Range    Magnesium 2.1 1.6 - 2.6 mg/dL   GLUCOSE, POC    Collection Time: 03/25/16  5:46 AM   Result Value Ref Range    Glucose (POC) 125 (H) 70 - 110 mg/dL      Results for Lacey Ward, Lacey Ward (MRN [id number]) as of 03/25/2016 07:16   Ref. Range 03/24/2016 07:41 03/24/2016 13:10 03/24/2016 21:38 03/25/2016 05:46   GLUCOSE,FAST - POC Latest Ref Range: 70 - 110 mg/dL 147107 829193 (H) 562103 130125 (H)     Pathology is pending.    Assessment:     Active Problems:    GERD (gastroesophageal reflux disease) (03/24/2016)      Diabetes mellitus, type 2 (HCC) (03/24/2016)      Hypertension (03/24/2016)      Urinary, incontinence, stress female (03/24/2016)      OSA (obstructive sleep apnea) (03/24/2016)      Metabolic syndrome (03/24/2016)      Chronic lower back pain (03/24/2016)      Morbid obesity due to excess calories (HCC) (03/24/2016)    POst op stable.    HTN uncontrolled,  Wrote for PO Coreg, If UGI ok will add back her usual meds too.    Plan:     UGI this AM  Some of her pain issues is because she is off her usual Celebrex, once her stomach heals she can go back on this.     Turn down IV too.    Signed By: Valera Castleobert J Ifeanyichukwu Wickham, MD     March 25, 2016        ADDENDUM 0750    I watched the UGI with the radiology PA=> no leak or obstruction, rapid flow through a good looking sleeve.   I brought Lacey Ward ice chips, will start stage 2 diet too and her Amlodipine too.    She may not not go home to day unless her nausea abates.   I will write for PO  Hycet elixir, sometimes the IV Dilaudid is the cause of significant nausea.

## 2016-03-25 NOTE — Other (Signed)
Bedside and Verbal shift change report given to Dan,RN (oncoming nurse) by Oralia Manisaniel T Kornegay, RN (offgoing nurse). Report included the following information SBAR, Kardex, MAR and Recent Results.    SITUATION:   ? Code Status: Full Code  ? Reason for Admission: E66.01 MORBID OBESITY; I10 HYPERTENTION; K21.9 GERD; N39.3 USI; E11.9 DIABETES; E88.81 METABOLIC SYNDROME; E78.4 HYPERLIPIDEMIA; G47.33 OSA  ? Morbid obesity due to excess calories (HCC)  ? Diabetes mellitus, type 2 (HCC)  ? Hypertension  ? OSA (obstructive sleep apnea)  ? Metabolic syndrome  ? GERD (gastroesophageal reflux disease)  ? Urinary, incontinence, stress female  ? Chronic lower back pain    ? Hospital day: 1  ? Problem List:       Hospital Problems  Date Reviewed: 03/24/2016          Codes Class Noted POA    GERD (gastroesophageal reflux disease) ICD-10-CM: K21.9  ICD-9-CM: 530.81  03/24/2016 Unknown        Diabetes mellitus, type 2 (HCC) ICD-10-CM: E11.9  ICD-9-CM: 250.00  03/24/2016 Unknown        Hypertension ICD-10-CM: I10  ICD-9-CM: 401.9  03/24/2016 Unknown        Urinary, incontinence, stress female ICD-10-CM: N39.3  ICD-9-CM: 625.6  03/24/2016 Unknown        OSA (obstructive sleep apnea) ICD-10-CM: G47.33  ICD-9-CM: 327.23  03/24/2016 Unknown        Metabolic syndrome ICD-10-CM: E88.81  ICD-9-CM: 277.7  03/24/2016 Unknown        Chronic lower back pain ICD-10-CM: M54.5, G89.29  ICD-9-CM: 724.2, 338.29  03/24/2016 Unknown        Morbid obesity due to excess calories (HCC) ICD-10-CM: E66.01  ICD-9-CM: 278.01  03/24/2016 Unknown              BACKGROUND:    Past Medical History:   Past Medical History:   Diagnosis Date   ??? Autoimmune disease (HCC)    ??? Diabetes (HCC)     no meds   ??? GERD (gastroesophageal reflux disease)    ??? Hypertension    ??? Nausea & vomiting          Patient taking anticoagulants yes     ASSESSMENT:   ? Changes in Assessment Throughout Shift: no    ? Patient has Central Line: no Reasons if yes: .   ? Patient has Foley Cath: yes Reasons if yes: surg     ? Last Vitals:     Vitals:    03/25/16 0352 03/25/16 1244 03/25/16 1428 03/25/16 1601   BP: (!) 189/106 (!) 149/94 147/82 144/80   Pulse: (!) 107 (!) 112 93 95   Resp: 16 22  16    Temp: 98.8 ??F (37.1 ??C) 96.6 ??F (35.9 ??C)  98.9 ??F (37.2 ??C)   SpO2: 91% 94%  96%   Weight:       Height:           ? IV and DRAINS (will only show if present)   Peripheral IV 03/24/16 Left Arm-Site Assessment: Clean, dry, & intact    ? WOUND (if present)   Wound Type:  abd lap sites   Dressing present Dressing Present : No   Wound Concerns/Notes:  none    ? PAIN    Pain Assessment    Pain Intensity 1: 0 (03/25/16 0056)    Pain Location 1: Abdomen    Pain Intervention(s) 1: Medication (see MAR)    Patient Stated Pain Goal: 2  o Interventions  for Pain:  See mar  o Intervention effective: yes  o Time of last intervention: see mar   o Reassessment Completed: yes     ? Last 3 Weights:  Last 3 Recorded Weights in this Encounter    03/05/16 0939 03/24/16 0806   Weight: 135.2 kg (298 lb) 129.3 kg (285 lb)     Weight change:     ? INTAKE/OUPUT    Current Shift: 07/13 0701 - 07/13 1900  In: 1668.8 [P.O.:270; I.V.:1398.8]  Out: -     Last three shifts: 07/11 1901 - 07/13 0700  In: 2335 [I.V.:2335]  Out: 4150 [Urine:4100]    ? LAB RESULTS     Recent Labs      03/25/16   0348   WBC  10.7   HGB  12.5   HCT  39.1   PLT  262        Recent Labs      03/25/16   0348   NA  136   K  3.5   GLU  118*   BUN  4*   CREA  0.62   CA  8.4*   MG  2.1       RECOMMENDATIONS AND DISCHARGE PLANNING     1. Pending tests/procedures/ Plan of Care or Other Needs: no     2. Discharge plan for patient and Needs/Barriers: home    3. Estimated Discharge Date: 1-14 Posted on Whiteboard in Patient???s Room: yes      4. The patient's care plan was reviewed with the oncoming nurse.       "HEALS" SAFETY CHECK      Fall Risk    Total Score: 3    Safety Measures: Safety Measures: Bed/Chair-Wheels locked, Bed in low  position, Call light within reach    A safety check occurred in the patient's room between off going nurse and oncoming nurse listed above.    The safety check included the below items  Area Items   H  High Alert Medications ? Verify all high alert medication drips (heparin, PCA, etc.)   E  Equipment ? Suction is set up for ALL patients (with yanker)  ? Red plugs utilized for all equipment (IV pumps, etc.)  ? WOW???s wiped down at end of shift.  ? Room stocked with oxygen, suction, and other unit-specific supplies   A  Alarms ? Bed alarm is set for fall risk patients  ? Ensure chair alarm is in place and activated if patient is up in a chair   L  Lines ? Check IV for any infiltration  ? Foley bag is empty if patient has a Foley   ? Tubing and IV bags are labeled   S  Safety   ? Room is clean, patient is clean, and equipment is clean.  ? Hallways are clear from equipment besides carts.   ? Fall bracelet on for fall risk patients  ? Ensure room is clear and free of clutter  ? Suction is set up for ALL patients (with yanker)  ? Hallways are clear from equipment besides carts.   ? Isolation precautions followed, supplies available outside room, sign posted     Oralia Manis, RN

## 2016-03-25 NOTE — Progress Notes (Signed)
Chaplain conducted an initial consultation and Spiritual Assessment [facility], who is a 45 y.o.,female. Patient???s Primary Language is: [place].   According to the patient???s EMR Religious Affiliation is: Jehovah witness.     The reason the Patient came to the hospital is:   Patient Active Problem List    Diagnosis Date Noted   ??? GERD (gastroesophageal reflux disease) 03/24/2016   ??? Diabetes mellitus, type 2 (HCC) 03/24/2016   ??? Hypertension 03/24/2016   ??? Urinary, incontinence, stress female 03/24/2016   ??? OSA (obstructive sleep apnea) 03/24/2016   ??? Metabolic syndrome 03/24/2016   ??? Chronic lower back pain 03/24/2016   ??? Morbid obesity due to excess calories (HCC) 03/24/2016        The Chaplain provided the following Interventions:  Initiated a relationship of care and support.   Provided information about Spiritual Care Services.  Chart reviewed.    The following outcomes where achieved:  Patient expressed gratitude for chaplain's visit.    Assessment:  There are no spiritual or religious issues which require intervention at this time.     Plan:  Chaplains will continue to follow and will provide pastoral care on an as needed/requested basis.  Chaplain recommends bedside caregivers page chaplain on duty if patient shows signs of acute spiritual or emotional distress.    Chaplain Eilene GhaziKerry Pokines  Spiritual Care  [phone])

## 2016-03-25 NOTE — Progress Notes (Signed)
0500-Pt ambulates well. Voiding well after foley D/C'd. Nausea persists, with one episode of dry heaves. B/P and HR elevated.   Visit Vitals   ??? BP (!) 189/106 (BP 1 Location: Right arm, BP Patient Position: At rest)   ??? Pulse (!) 107   ??? Temp 98.8 ??F (37.1 ??C)   ??? Resp 16   ??? Ht 5\' 4"  (1.626 m)   ??? Wt 129.3 kg (285 lb)   ??? SpO2 91%   ??? Breastfeeding No   ??? BMI 48.92 kg/m2

## 2016-03-25 NOTE — Progress Notes (Signed)
Previous vitals were taken right after patient was walking. I had tech recheck VS HR 93 BP 147/82.

## 2016-03-25 NOTE — Progress Notes (Signed)
Care Management Interventions  PCP Verified by CM: Yes Jamison Oka([facility])  Palliative Care Consult (Criteria: CHF and RRAT>21): No  Reason for No Palliative Care Consult: Other (see comment)  Mode of Transport at Discharge: Self (pt's family)  Transition of Care Consult (CM Consult): Discharge Planning  MyChart Signup: No  Discharge Durable Medical Equipment: No  Health Maintenance Reviewed: Yes  Physical Therapy Consult: No  Occupational Therapy Consult: No  Speech Therapy Consult: No  Current Support Network: Relative's Home (patient lives with her parents and two daughetrs)  Confirm Follow Up Transport: Self  Plan discussed with Pt/Family/Caregiver: Yes  Discharge Location  Discharge Placement: Home    Patient 45 years old admitted to med-surg unit with morbid obesity, 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  Saw the patient, she is AAO x 4, open to conversation. She shares, she lives with her parents and two daughters. Patient self care 7 independent prior admission, drives a car, no DMEs in use and no need voiced. DCP home with family support.  Patient has PCP-Dr. [facility].

## 2016-03-25 NOTE — Progress Notes (Signed)
Patient in bed states she does not feel well. After encouraging her to be more specific she has nausea. I spent time with her explaining reasons for nausea. She was educated on progression of diet today and she understood. I educated her on small frequent sips. I educated her on how to swallow without swallowing air, she needs much positive encouragement.  We discussed nausea and I taught her how to drink. She was educated on use of I.S. While writung this note she is in the hall walking. We discussed her pain control and management of pain. I will continue to educate and assist patient today. I fell she will meet her PO goals possibly today.

## 2016-03-25 NOTE — Other (Incomplete)
Interdisciplinary Round Note   Patient Information: Patient Information: Lacey Ward                                      [birth year]   Reason for Admission: E66.01 MORBID OBESITY; I10 HYPERTENTION; K21.9 GERD; N39.3 USI; E11.9 DIABETES; E88.81 METABOLIC SYNDROME; E78.4 HYPERLIPIDEMIA; G47.33 OSA  Morbid obesity due to excess calories (HCC)  Diabetes mellitus, type 2 (HCC)  Hypertension  OSA (obstructive sleep apnea)  Metabolic syndrome  GERD (gastroesophageal reflux disease)  Urinary, incontinence, stress female  Chronic lower back pain   Attending Provider:   Valera Castle, MD  Primary Care Physician:       Phys Other, MD       None   Past Medical History:   Past Medical History:   Diagnosis Date   ??? Autoimmune disease (HCC)    ??? Diabetes (HCC)     no meds   ??? GERD (gastroesophageal reflux disease)    ??? Hypertension    ??? Nausea & vomiting       Hospital day: 1  Estimated discharge date: 7/14  RRAT Score: Low Risk            8       Total Score        3 Relationship with PCP    4 More than 1 Admission in calendar year    1 Charlson Comorbidity Score        Criteria that do not apply:    Patient Living Status    Patient Length of Stay > 5    Patient Insurance is Medicare, Medicaid or Self Pay       Goals for Today: pain control safety      Overnight Events: n/a     VITAL SIGNS  Vitals:    03/24/16 1600 03/24/16 1907 03/25/16 0029 03/25/16 0352   BP: 149/89 157/88 152/85 (!) 189/106   Pulse: (!) 107 (!) 104 100 (!) 107   Resp: Temp: 97.7 ??F (36.5 ??C) 98.1 ??F (36.7 ??C) 97 ??F (36.1 ??C) 98.8 ??F (37.1 ??C)   SpO2: 93% 95% 91% 91%   Weight:       Height:              Lines, Drains, & Airways    Peripheral IV 03/24/16 Left Arm-Site Assessment: Clean, dry, & intact       VTE Prophylaxis               Sequential Compression Device: Bilateral                   Intake and Output:   07/11 1901 - 07/13 0700  In: 2335 [I.V.:2335]  Out: 4150 [Urine:4100]  07/13 0701 - 07/13 1900  In: 120 [P.O.:120]   Out: -  Current Diet: DIET BARIATRIC CLEAR LIQUID No Conc. Sweets  DIET NUTRITIONAL SUPPLEMENTS All Meals; UNJURY       Abdominal   Last Bowel Movement Date: 03/23/16  Abdominal Assessment: Nausea  Appetite: NPO  Bowel Sounds: Hypoactive   GI Prophylaxis: yes        Type: zofran        Recent Glucose Results:   Lab Results   Component Value Date/Time    GLU 118 (H) 03/25/2016 03:48 AM    GLUCPOC 125 (H) 03/25/2016 05:46 AM  GLUCPOC 103 03/24/2016 09:38 PM    GLUCPOC 193 (H) 03/24/2016 01:10 PM          IV Antibiotics? n/a       When started: n/a   Activity Level:  Activity Level: Up with Assistance  Needs assistance with ADLs: no  PT Consult Status: n/a Current Immunizations:    There is no immunization history on file for this patient.       Recommendations:       Needs for Discharge: *** Recommendations from IDR team: ***    Other Notes: ***

## 2016-03-25 NOTE — Other (Signed)
Bedside and Verbal shift change report given to French Anaracy, RN (oncoming nurse) by Maeola Harmananiel R Slamin, RN (offgoing nurse). Report included the following information SBAR, Kardex, MAR and Recent Results.    SITUATION:   ? Code Status: Full Code  ? Reason for Admission: E66.01 MORBID OBESITY; I10 HYPERTENTION; K21.9 GERD; N39.3 USI; E11.9 DIABETES; E88.81 METABOLIC SYNDROME; E78.4 HYPERLIPIDEMIA; G47.33 OSA  ? Morbid obesity due to excess calories (HCC)  ? Diabetes mellitus, type 2 (HCC)  ? Hypertension  ? OSA (obstructive sleep apnea)  ? Metabolic syndrome  ? GERD (gastroesophageal reflux disease)  ? Urinary, incontinence, stress female  ? Chronic lower back pain    ? Hospital day: 1  ? Problem List:       Hospital Problems  Date Reviewed: 03/24/2016          Codes Class Noted POA    GERD (gastroesophageal reflux disease) ICD-10-CM: K21.9  ICD-9-CM: 530.81  03/24/2016 Unknown        Diabetes mellitus, type 2 (HCC) ICD-10-CM: E11.9  ICD-9-CM: 250.00  03/24/2016 Unknown        Hypertension ICD-10-CM: I10  ICD-9-CM: 401.9  03/24/2016 Unknown        Urinary, incontinence, stress female ICD-10-CM: N39.3  ICD-9-CM: 625.6  03/24/2016 Unknown        OSA (obstructive sleep apnea) ICD-10-CM: G47.33  ICD-9-CM: 327.23  03/24/2016 Unknown        Metabolic syndrome ICD-10-CM: E88.81  ICD-9-CM: 277.7  03/24/2016 Unknown        Chronic lower back pain ICD-10-CM: M54.5, G89.29  ICD-9-CM: 724.2, 338.29  03/24/2016 Unknown        Morbid obesity due to excess calories (HCC) ICD-10-CM: E66.01  ICD-9-CM: 278.01  03/24/2016 Unknown              BACKGROUND:    Past Medical History:   Past Medical History:   Diagnosis Date   ??? Autoimmune disease (HCC)    ??? Diabetes (HCC)     no meds   ??? GERD (gastroesophageal reflux disease)    ??? Hypertension    ??? Nausea & vomiting          Patient taking anticoagulants yes     ASSESSMENT:   ? Changes in Assessment Throughout Shift: post-op    ? Patient has Central Line: no Reasons if yes:    ? Patient has Foley Cath: no Reasons if yes:      ? Last Vitals:     Vitals:    03/24/16 1600 03/24/16 1907 03/25/16 0029 03/25/16 0352   BP: 149/89 157/88 152/85 (!) 189/106   Pulse: (!) 107 (!) 104 100 (!) 107   Resp: 16 15 18 16    Temp: 97.7 ??F (36.5 ??C) 98.1 ??F (36.7 ??C) 97 ??F (36.1 ??C) 98.8 ??F (37.1 ??C)   SpO2: 93% 95% 91% 91%   Weight:       Height:           ? IV and DRAINS (will only show if present)   Peripheral IV 03/24/16 Left Arm-Site Assessment: Clean, dry, & intact    ? WOUND (if present)   Wound Type:  Lap sites   Dressing present Dressing Present : No   Wound Concerns/Notes:  none    ? PAIN    Pain Assessment    Pain Intensity 1: 0 (03/25/16 0056)    Pain Location 1: Abdomen    Pain Intervention(s) 1: Medication (see MAR)    Patient Stated Pain Goal: 2  o Interventions for Pain:  See mar  o Intervention effective: yes  o Time of last intervention: see mar   o Reassessment Completed: yes     ? Last 3 Weights:  Last 3 Recorded Weights in this Encounter    03/05/16 0939 03/24/16 0806   Weight: 135.2 kg (298 lb) 129.3 kg (285 lb)     Weight change:     ? INTAKE/OUPUT    Current Shift: 07/12 1901 - 07/13 0700  In: 785 [I.V.:785]  Out: 3650 [Urine:3650]    Last three shifts: 07/11 0701 - 07/12 1900  In: 1550 [I.V.:1550]  Out: 500 [Urine:450]    ? LAB RESULTS     Recent Labs      03/25/16   0348   WBC  10.7   HGB  12.5   HCT  39.1   PLT  262        Recent Labs      03/25/16   0348   NA  136   K  3.5   GLU  118*   BUN  4*   CREA  0.62   CA  8.4*   MG  2.1       RECOMMENDATIONS AND DISCHARGE PLANNING     1. Pending tests/procedures/ Plan of Care or Other Needs: gastrographin     2. Discharge plan for patient and Needs/Barriers: home    3. Estimated Discharge Date: today Posted on Whiteboard in Patient???s Room: yes      4. The patient's care plan was reviewed with the oncoming nurse.       "HEALS" SAFETY CHECK      Fall Risk    Total Score: 3     Safety Measures: Safety Measures: Bed/Chair-Wheels locked, Bed in low position, Call light within reach, Side rails X 3    A safety check occurred in the patient's room between off going nurse and oncoming nurse listed above.    The safety check included the below items  Area Items   H  High Alert Medications ? Verify all high alert medication drips (heparin, PCA, etc.)   E  Equipment ? Suction is set up for ALL patients (with yanker)  ? Red plugs utilized for all equipment (IV pumps, etc.)  ? WOW???s wiped down at end of shift.  ? Room stocked with oxygen, suction, and other unit-specific supplies   A  Alarms ? Bed alarm is set for fall risk patients  ? Ensure chair alarm is in place and activated if patient is up in a chair   L  Lines ? Check IV for any infiltration  ? Foley bag is empty if patient has a Foley   ? Tubing and IV bags are labeled   S  Safety   ? Room is clean, patient is clean, and equipment is clean.  ? Hallways are clear from equipment besides carts.   ? Fall bracelet on for fall risk patients  ? Ensure room is clear and free of clutter  ? Suction is set up for ALL patients (with yanker)  ? Hallways are clear from equipment besides carts.   ? Isolation precautions followed, supplies available outside room, sign posted     Maeola Harman, RN

## 2016-03-25 NOTE — Progress Notes (Signed)
Post op diet progression discussed with patient.  Patient to be discharged on a bariatric clear liquid diet.  Patient verbalizes understanding of bariatric clear liquid and bariatric soft and moist diet.    All of the patients questions and concerns have been addressed prior to discharge.  Patient to follow up with surgeon in 7-14 days.            General Care after Surgery    1. No lifting over 15 lbs for 4 weeks.    2. No driving while taking the pain medication (approximately 7-10 days).    3. No tub baths, swimming or hot tubs until incisions are healed. (about 2 weeks)    4. You may shower. Clean incisions daily /gently with soap and check incisions for signs of infection:  ? Redness around incision  ? Swelling at site  ? Drainage with an foul odor (pus)  ? Increase tenderness around incision    5. Take your temperature and resting pulse in the morning and evening. Record on tracking form given to you. Call if your temperature is greater than 101 or your pulse rate is greater than 115.    6. Please contact your surgeon if you are having excessive abdominal pain (that lasts longer than 4 hours and does not improve with prescribed pain medication), vomiting or shortness of breath.    7. Get up and move - do not sit in one place for more than an hour.     8. You need to WALK (EXERCISE) for 30 minutes per day. (Walking around your house does not count)      a. Bike, treadmill and elliptical are OK  b. NO weight lifting or sit ups    9.  If constipated take an adult dose of Mira lax (available over the counter).  Contact the doctor???s office if Mira lax doesn't???T help.    10.  You may swallow pills starting the day after surgery as long as they fit inside this circle:                                            11.  Continue to use your incentive spirometer (breathing machine) for the next couple of weeks to help prevent pneumonia.    Phone numbers:  [facility] [phone]   [facility] [phone]  Liz???s phone #: [phone]  Annice PihJackie???s phone# [phone]   Barbara???s phone#: [phone]  Pam???s phone#: [phone]    Key Diet Principles Following Bariatric Surgery     1. Begin each meal with soft moist high protein foods (i.e. chicken, [place], yogurt, tuna, eggs, cottage cheese, other fish and seafood).     2.  Consume a minimum of 64 oz. of fluid each day to prevent dehydration.  No straws.    3.  No food and fluid together.  Stop drinking 30 minutes before a meal.  You may begin fluids again 30 minutes after you finish a meal.    4.  Eat very slowly and chew all foods completely.    6.  Keep portions small.    7.  No simple sugars or high fat foods.  No carbonated beverages. No Caffeine.    8.  Eat 3 meals per day.  No skipping.  Avoid snacking between meals.    9.  No  alcohol.  No Smoking.    10.  Two Flintstone Complete Chewable vitamins each day.  Take one in the morning and one at night.    11. 1500 mg Calcium Citrate per day in separate dosages      12.  Vitamin D 3: 5000 IU taken per day.        13. Vitamin B-12:  Take 1000 mcg sublingually daily    14. Iron: 60 mg per day for women menstruating    15.  Protein supplements of your choice.  Must be low sugar (0-3 gm), low fat    (0-3 gm) and provide at least 35-40 gm of protein each day. You need a total     (food + supplements) of 60 - 70 grams of protein each day     16. Minimum of 30 minutes of physical activity daily.  17. Do not take NSAID or Steroids without your surgeons permission.    [place] Gastric Bypass and Sleeve Dietary Progression    Patient Name: Lacey Ward      Date of Surgery:  7/12         Begin Bariatric Clear Liquid Diet on: 7/13    Clear Liquid Diet: 64 oz. of fluid per day  o Low calorie, low sugar, non-carbonated beverages  ? Water, Crystal Light, Propel Water, Sugar Free Jell-O, Sugar Free Popsicles, Bouillon   ? Start protein supplement during this stage. (60-70 grams per day)  ? Getting your fluid in and staying hydrated is your #1 priority!    ? The clear liquid diet will last for 7 days.    Begin Bariatric Soft and Moist on:   ? This stage of the diet will last for 5 weeks, unless otherwise instructed by your surgeon.  ? Begin:  1 week post-op   ? End:  6 weeks post-op (or when you follow up with the Registered Dietitian)    ? Soft, moist, high protein foods: 3 meals per day plus protein supplements.  o   Portions should emphasize on soft protein.  o Portions will be a MAXIMUM of:  o  1 ounce of solid food  o  2-3 ounces of cottage cheese and yogurt.  o Protein supplements should be between meals and provide 30-40 grams per day during soft protein diet.  o Continue to get 64 ounces of fluid in per day.    ? Protein foods that are ok on the Soft and Moist Diet include:  o Slow transition:  o 1st week on soft protein should focus on: Yogurt, cottage cheese, eggs, vegetarian refried beans, black beans, kidney beans, white beans.  NO BAKED BEANS  o 2nd -4th  week on soft protein diet should focus on: yogurt, cottage cheese, eggs, canned tuna, canned chicken, tilapia, fish (needs to be soft enough to be cut up with a fork)  o 5th week on soft protein diet should focus on: Yogurt, cottage cheese, eggs, canned tuna, canned chicken, tilapia, fish, salmon, chicken breast, or [place].    Remember to continue to get 64 ounces of fluid daily on ALL Stages.    To be advanced to Bariatric Maintenance Stage of the bariatric diet, follow up with the dietitian 6 weeks post-op, around:                   TEMPERATURE/HEART RATE LOG  WEEK 1  Day Date Morning temperature Morning heart rate Evening temperature Evening heart rate   1  2        3        4        5        6        7           WEEK 2  Day Date Morning temperature Morning heart rate Evening temperature Evening heart rate   1        2        3        4        5        6         7           Instructions:  Take your temperature and heart rate (pulse) twice a day for 14 days.  Take both in the morning and evening at about the same time each day (when you wake up and before you go to bed when you are relaxed)  Please contact your doctor???s office if your:  ? Temperature is higher than 101??  ? Heart rate (pulse) is higher than 115 beats per minute    (normal heart rate is 60-100 beats per minute)    [facility]  [phone]  [facility]:    [phone]        HOW TO TAKE YOUR HEART RATE (PULSE)         How to do it:  1. Turn your left hand so that your palm is face-up.  2. With the index and middle fingers of your right hand, draw a line from the base of your thumb to just below the crease in your wrist. Your fingers should nestle just to the left of the large tendon that pops up when you bend your wrist toward you.  3. Roe Coombson???T press too hard, that will make the pulse go away. Use gentle pressure.  4. Wait. It can take several seconds???and several micro-adjustments in the placement of your two fingers on your wrist???to find your pulse. Just keep moving your fingers down or up your wrist in small increments (and pausing for a few seconds) until you find it.  5. To take your pulse rate:  1. Find a watch with a second hand and place it on your right wrist or on the table next to your left hand.  2. After finding your pulse, count the number of beats for 20 seconds.  3. Multiply by 3 to get your heart rate, or beats per minute (or just count for 60 seconds for a math-free option).  4. Normal, resting heart rate is about 60-100 beats per minute.    Remember to keep all you follow up appointments and to have your labs drawn.

## 2016-03-26 LAB — CBC WITH AUTOMATED DIFF
ABS. BASOPHILS: 0.1 10*3/uL (ref 0.0–0.1)
ABS. EOSINOPHILS: 0.3 10*3/uL (ref 0.0–0.4)
ABS. LYMPHOCYTES: 2.8 10*3/uL (ref 0.9–3.6)
ABS. MONOCYTES: 0.9 10*3/uL (ref 0.05–1.2)
ABS. NEUTROPHILS: 6.5 10*3/uL (ref 1.8–8.0)
BASOPHILS: 1 % (ref 0–2)
EOSINOPHILS: 3 % (ref 0–5)
HCT: 40.2 % (ref 35.0–45.0)
HGB: 12.7 g/dL (ref 12.0–16.0)
LYMPHOCYTES: 27 % (ref 21–52)
MCH: 28 PG (ref 24.0–34.0)
MCHC: 31.6 g/dL (ref 31.0–37.0)
MCV: 88.7 FL (ref 74.0–97.0)
MONOCYTES: 8 % (ref 3–10)
MPV: 10.5 FL (ref 9.2–11.8)
NEUTROPHILS: 61 % (ref 40–73)
PLATELET: 291 10*3/uL (ref 135–420)
RBC: 4.53 M/uL (ref 4.20–5.30)
RDW: 15.4 % — ABNORMAL HIGH (ref 11.6–14.5)
WBC: 10.6 10*3/uL (ref 4.6–13.2)

## 2016-03-26 LAB — RENAL FUNCTION PANEL
Albumin: 3.2 g/dL — ABNORMAL LOW (ref 3.4–5.0)
Anion gap: 7 mmol/L (ref 3.0–18)
BUN/Creatinine ratio: 8 — ABNORMAL LOW (ref 12–20)
BUN: 5 MG/DL — ABNORMAL LOW (ref 7.0–18)
CO2: 29 mmol/L (ref 21–32)
Calcium: 8.8 MG/DL (ref 8.5–10.1)
Chloride: 102 mmol/L (ref 100–108)
Creatinine: 0.64 MG/DL (ref 0.6–1.3)
GFR est AA: >60 mL/min/{1.73_m2} (ref >60)
GFR est non-AA: >60 mL/min/{1.73_m2} (ref >60)
Glucose: 100 mg/dL — ABNORMAL HIGH (ref 74–99)
Phosphorus: 3 MG/DL (ref 2.5–4.9)
Potassium: 3.9 mmol/L (ref 3.5–5.5)
Sodium: 138 mmol/L (ref 136–145)

## 2016-03-26 LAB — MAGNESIUM: Magnesium: 2.2 mg/dL (ref 1.6–2.6)

## 2016-03-26 LAB — GLUCOSE, POC
Glucose (POC): 123 mg/dL — ABNORMAL HIGH (ref 70–110)
Glucose (POC): 99 mg/dL (ref 70–110)
Glucose (POC): 126 mg/dL — ABNORMAL HIGH (ref 70–110)

## 2016-03-26 MED ORDER — LOSARTAN 50 MG TAB
50 mg | Freq: Every day | ORAL | Status: DC
Start: 2016-03-26 — End: 2016-03-26
  Administered 2016-03-26: 15:00:00 via ORAL

## 2016-03-26 MED ORDER — ACETAMINOPHEN (TYLENOL) SOLUTION 32MG/ML
ORAL | Status: DC | PRN
Start: 2016-03-26 — End: 2016-03-26

## 2016-03-26 MED ORDER — CARVEDILOL 3.125 MG TAB
3.125 mg | ORAL_TABLET | ORAL | 1 refills | Status: AC
Start: 2016-03-26 — End: ?

## 2016-03-26 MED ORDER — ATORVASTATIN 20 MG TAB
20 mg | Freq: Every evening | ORAL | Status: DC
Start: 2016-03-26 — End: 2016-03-26

## 2016-03-26 MED ORDER — FAMOTIDINE 40 MG/5 ML ORAL SUSP
40 mg/5 mL (8 mg/mL) | Freq: Two times a day (BID) | ORAL | 2 refills | Status: AC
Start: 2016-03-26 — End: ?

## 2016-03-26 MED ORDER — HYDROCODONE-ACETAMINOPHEN 7.5 MG-325 MG/15 ML ORAL SOLN
ORAL | 0 refills | Status: AC | PRN
Start: 2016-03-26 — End: ?

## 2016-03-26 MED FILL — ENOXAPARIN 40 MG/0.4 ML SUB-Q SYRINGE: 40 mg/0.4 mL | SUBCUTANEOUS | Qty: 0.4

## 2016-03-26 MED FILL — CARVEDILOL 12.5 MG TAB: 12.5 mg | ORAL | Qty: 1

## 2016-03-26 MED FILL — LOSARTAN 50 MG TAB: 50 mg | ORAL | Qty: 1

## 2016-03-26 MED FILL — FAMOTIDINE (PF) 20 MG/2 ML IV: 20 mg/2 mL | INTRAVENOUS | Qty: 2

## 2016-03-26 MED FILL — PREDNISONE 5 MG TAB: 5 mg | ORAL | Qty: 1

## 2016-03-26 MED FILL — AMLODIPINE 5 MG TAB: 5 mg | ORAL | Qty: 1

## 2016-03-26 NOTE — Discharge Summary (Signed)
[facility]  DISCHARGE SUMMARY    Name:  MERRIAM, BRANDNER  MR#:  [id number]  DOB:  [birth year]  Account #:  [id number]  Date of Adm:  03/24/2016  Date of Discharge:  03/26/2016      ADMISSION DIAGNOSES: Morbid obesity, secondary to excess food  calories, body mass index 49.2.    Co-morbidities:  1. Diabetes (new diagnosis, hemoglobin A1c 6.9).  2. Hypertension.  3. Obstructive sleep apnea.  4. Metabolic syndrome.  5. Gastrointestinal reflux disease.  6. Urinary stress incontinence.  7. Lower back pain.    DISCHARGE DIAGNOSES: Morbid obesity, secondary to excess food  calories, body mass index 49.2.    Co-morbidities:  1. Diabetes (new diagnosis, hemoglobin A1c 6.9).  2. Hypertension.  3. Obstructive sleep apnea.  4. Metabolic syndrome.  5. Gastrointestinal reflux disease.  6. Urinary stress incontinence.  7. Lower back pain.    PROCEDURES PERFORMED:  1. Laparoscopic sleeve gastrectomy constructed over 76 French GiViSi  tube.  2. Intraoperative pneumatic leak testing.    HOSPITAL COURSE: The patient was admitted for an elective  laparoscopic sleeve gastrectomy to treat her morbid obesity and her  multiple weight related co-morbidities as listed above. This was  performed on the day of her admission without difficulty. A 36 French  GiViSi tube and Peri-strips buttressing was used. She  did not have a hiatal hernia.  Her liver appeared normal, as did her  preoperative liver function tests, therefore, no liver biopsy was felt  warranted. Postoperative she did have some nausea and retching. A  scopolamine patch had been placed by Anesthesia. By the following  morning she was still having nausea, but no more retching. Her routine  laboratory studies performed the day after surgery showed a white  blood cell count of 10.7, hematocrit of 39.1, with 68% segmented  neutrophils and 23% lymphocytes. Her electrolyte studies were notable  for a glucose of 118, electrolytes were normal. She did undergo a   routine upper GI study and I watched with the radiology physician  assistant. This showed no evidence of leak or obstruction. Her point of  care glucose testing overnight was a maximum of 193 just after  surgery and a small dose of insulin was used for this. All subsequent  glucose monitoring was below 126 and no additional insulin was  needed. With her negative upper GI study she was started on clear  liquids supplemented with UNJURY protein drinks. However, for the  first 24 hours she consumed only about total of 450 mL and did not  meet any discharge criteria of 4 or more ounces every hour in order to  be discharged home. On postop day 1 she was started back on her  usual dose of prednisone 5 mg a day and amlodipine 5 mg a day. She  did have some mild tachycardia in the 107 with hypertensive readings,  therefore, Coreg was started instead of her usual losartan. On postop  day #2 repeat laboratory studies showed a white blood cell count of  10.6, hematocrit 40.2, and differential of 61% neutrophils and 23%  lymphocytes. Electrolyte studies were normal, potassium 3.9, creatinine  0.64, and a glucose down to 100. For several hours the second  morning she was tolerating the liquids much better and has now in  over only a few hours consumed 570 mL, now able to tolerate 4 or  more ounces every hour. Her blood pressure, however, has been still  quite elevated and the  usual dose of losartan 50 mg a day was added.  Her most recent blood pressure is 157/97, a pulse of 98. This is better  than earlier in the day when it was 168/109. She has been having no  fevers.    Her discharge medications will be:  1. Lortab elixir 15 mL by mouth every 4 hours as needed. In the  hospital this caused some nausea, and instead she may use just  regular Tylenol elixir 500 mg every 4 hours. (OTC product)  2. She will continue on her Cozaar 50 mg crushed once a day.  3. Amlodipine 5 mg crushed once a day.   4. Carvedilol 3.125 mg tablets, 3 tablets or 9.375 mg twice a day.  5. She may continue her Lipitor.  6. Prednisone.  7. Arava.    I have prescribed Pepcid suspension 20 mg or 2-1/2 mL by mouth  twice a day. This will replace her oral proton pump inhibitor.    Diet will consist of liquids only for the first 2 weeks, she knows to  supplement with high protein, low carbohydrate drinks with a goal of  ingesting greater than 60 grams of protein a day.    Activity level: She may shower normally. I recommend no lifting greater  than 15 pounds for the first 2 weeks, and of course no driving while  taking narcotic pain medications.    Warning signs: I have explained to her the warning signs after bariatric  surgery. This includes a fever greater than 101, worsening abdominal  pain, the development of significant nausea and vomiting and  intolerance to her liquids, and a resting tachycardia greater than 120. I  have also asked her to contact me if she developed lightheaded or  dizziness with standing. She normally does not check glucose levels,  as her diabetes was recently diagnosed and she does not have home  monitoring, nor was she on any anti-diabetes medications.    Followup has been arranged for my office on 04/01/2016 at 2:30 in the  afternoon. At that time if all is well she will be started on routine  bariatric vitamin supplementation.    Discharge Disposition: HOME        Valera CastleOBERT J. Kateena Degroote, MD    RC / LS  D:  03/26/2016   14:51  T:  03/26/2016   18:51  Job #:  [id number]    Mila MerryKerry Partis, [facility], [address], [place], [place]  [phone]

## 2016-03-26 NOTE — Other (Signed)
Patient tolerating bariatric CL diet. No vomiting. No pain management issues.

## 2016-03-26 NOTE — Other (Signed)
Patient continues to tolerate diet. No CP,SOB or dizziness. No pain management issues.

## 2016-03-26 NOTE — Other (Signed)
Bedside and Verbal shift change report given to French Ana, RN (oncoming nurse) by Maeola Harman, RN (offgoing nurse). Report included the following information SBAR, Kardex, MAR and Recent Results.    SITUATION:   ? Code Status: Full Code  Reason for Admission: E66.01 MORBID OBESITY; I10 HYPERTENTION; K21.9 GERD; N39.3 USI; E11.9 DIABETES; E88.81 METABOLIC SYNDROME; E78.4 HYPERLIPIDEMIA; G47.33 OSA  Morbid obesity due to excess calories (HCC)  Diabetes mellitus, type 2 (HCC)  Hypertension  OSA (obstructive sleep apnea)  Metabolic syndrome  GERD (gastroesophageal reflux disease)  Urinary, incontinence, stress female  ? Chronic lower back pain    ? Hospital day: 2  ? Problem List:       Hospital Problems  Date Reviewed: Mar 25, 2016          Codes Class Noted POA    GERD (gastroesophageal reflux disease) ICD-10-CM: K21.9  ICD-9-CM: 530.81  2016/03/25 Unknown        Diabetes mellitus, type 2 (HCC) ICD-10-CM: E11.9  ICD-9-CM: 250.00  03/25/2016 Unknown        Hypertension ICD-10-CM: I10  ICD-9-CM: 401.9  2016/03/25 Unknown        Urinary, incontinence, stress female ICD-10-CM: N39.3  ICD-9-CM: 625.6  March 25, 2016 Unknown        OSA (obstructive sleep apnea) ICD-10-CM: G47.33  ICD-9-CM: 327.23  03/25/2016 Unknown        Metabolic syndrome ICD-10-CM: E88.81  ICD-9-CM: 277.7  March 25, 2016 Unknown        Chronic lower back pain ICD-10-CM: M54.5, G89.29  ICD-9-CM: 724.2, 338.29  03/25/2016 Unknown        Morbid obesity due to excess calories (HCC) ICD-10-CM: E66.01  ICD-9-CM: 278.01  03/25/2016 Unknown              BACKGROUND:    Past Medical History:   Past Medical History:   Diagnosis Date   ??? Autoimmune disease (HCC)    ??? Diabetes (HCC)     no meds   ??? GERD (gastroesophageal reflux disease)    ??? Hypertension    ??? Nausea & vomiting          Patient taking anticoagulants yes     ASSESSMENT:   ? Changes in Assessment Throughout Shift: none    ? Patient has Central Line: no Reasons if yes:   ? Patient has Foley Cath: no Reasons if yes:       ? Last Vitals:     Vitals:    03/25/16 1601 03/25/16 2034 03/26/16 0003 03/26/16 0413   BP: 144/80 (!) 168/104 149/76 156/78   Pulse: 95 (!) 106 94 99   Resp: Temp: 98.9 ??F (37.2 ??C) 98.5 ??F (36.9 ??C) 98.5 ??F (36.9 ??C) 98.2 ??F (36.8 ??C)   SpO2: 96% 98% 90% 90%   Weight:       Height:           ? IV and DRAINS (will only show if present)   Peripheral IV 03/25/16 Right Antecubital-Site Assessment: Clean, dry, & intact  [REMOVED] Peripheral IV March 25, 2016 Left Arm-Site Assessment: Clean, dry, & intact    ? WOUND (if present)   Wound Type:  Lap sites   Dressing present Dressing Present : No   Wound Concerns/Notes:  none    ? PAIN    Pain Assessment    Pain Intensity 1: 2 (03/25/16 2343)    Pain Location 1: Abdomen    Pain Intervention(s) 1: Medication (see MAR)    Patient Stated Pain  Goal: 2  o Interventions for Pain:  See mar  o Intervention effective: yes  o Time of last intervention: see mar   o Reassessment Completed: yes     ? Last 3 Weights:  Last 3 Recorded Weights in this Encounter    03/05/16 0939 03/24/16 0806   Weight: 135.2 kg (298 lb) 129.3 kg (285 lb)     Weight change:     ? INTAKE/OUPUT    Current Shift: 07/13 1901 - 07/14 0700  In: 1017.5 [P.O.:180; I.V.:837.5]  Out: 700 [Urine:700]    Last three shifts: 07/12 0701 - 07/13 1900  In: 4003.8 [P.O.:270; I.V.:3733.8]  Out: 4150 [Urine:4100]    ? LAB RESULTS     Recent Labs      03/26/16   0225  03/25/16   0348   WBC  10.6  10.7   HGB  12.7  12.5   HCT  40.2  39.1   PLT  291  262        Recent Labs      03/26/16   0225  03/25/16   0348   NA  138  136   K  3.9  3.5   GLU  100*  118*   BUN  5*  4*   CREA  0.64  0.62   CA  8.8  8.4*   MG  2.2  2.1       RECOMMENDATIONS AND DISCHARGE PLANNING     1. Pending tests/procedures/ Plan of Care or Other Needs: gastrographin     2. Discharge plan for patient and Needs/Barriers: home    3. Estimated Discharge Date: today Posted on Whiteboard in Patient???s Room: yes       4. The patient's care plan was reviewed with the oncoming nurse.       "HEALS" SAFETY CHECK      Fall Risk    Total Score: 1    Safety Measures: Safety Measures: Bed/Chair-Wheels locked, Bed in low position, Call light within reach, Side rails X 3    A safety check occurred in the patient's room between off going nurse and oncoming nurse listed above.    The safety check included the below items  Area Items   H  High Alert Medications ? Verify all high alert medication drips (heparin, PCA, etc.)   E  Equipment ? Suction is set up for ALL patients (with yanker)  ? Red plugs utilized for all equipment (IV pumps, etc.)  ? WOW???s wiped down at end of shift.  ? Room stocked with oxygen, suction, and other unit-specific supplies   A  Alarms ? Bed alarm is set for fall risk patients  ? Ensure chair alarm is in place and activated if patient is up in a chair   L  Lines ? Check IV for any infiltration  ? Foley bag is empty if patient has a Foley   ? Tubing and IV bags are labeled   S  Safety   ? Room is clean, patient is clean, and equipment is clean.  ? Hallways are clear from equipment besides carts.   ? Fall bracelet on for fall risk patients  ? Ensure room is clear and free of clutter  ? Suction is set up for ALL patients (with yanker)  ? Hallways are clear from equipment besides carts.   ? Isolation precautions followed, supplies available outside room, sign posted     Maeola Harmananiel R Slamin, RN

## 2016-03-26 NOTE — Progress Notes (Signed)
Progress Note    Patient: Lacey ByesSerena Debell MRN: [id number]  SSN: [id number]    Date of Birth: 11/17/1970  Age: 45 y.o.  Sex: female      Admit Date: 03/24/2016    LOS: 2 days  POD#2 s/p sleeve gastrectomy    Subjective:     Feels better today.  Gets very full after 3 oz/hr and gets some cramps.  Drank Hycet elixir yesterday=>vomited it up.  Not in a a lot of pain, mostly "sore"  Burping a lot, think small amount flatus too.    I started her usual dose Prednisone (5 mg) daily too yesterday.    Objective:     Vitals:    03/25/16 1601 03/25/16 2034 03/26/16 0003 03/26/16 0413   BP: 144/80 (!) 168/104 149/76 156/78   Pulse: 95 (!) 106 94 99   Resp: 16 20 18 18    Temp: 98.9 ??F (37.2 ??C) 98.5 ??F (36.9 ??C) 98.5 ??F (36.9 ??C) 98.2 ??F (36.8 ??C)   SpO2: 96% 98% 90% 90%   Weight:       Height:            Intake and Output:  Current Shift:    Last three shifts: 07/12 1901 - 07/14 0700  In: 3471.3 [P.O.:450; I.V.:3021.3]  Out: 4350 [Urine:4350]    Physical Exam:   Looks well, sitting up, color good.  Lungs clear  Cor RR, slower today, BP still not well controlled all systolic BP's above 140.  Abdomen is soft all sites clean dry and closed  Minimal tenderness directly over right 15 mm site  Legs soft non tender and no swelling.    Lab/Data Review:  Recent Results (from the past 12 hour(s))   GLUCOSE, POC    Collection Time: 03/25/16  9:38 PM   Result Value Ref Range    Glucose (POC) 123 (H) 70 - 110 mg/dL   CBC WITH AUTOMATED DIFF    Collection Time: 03/26/16  2:25 AM   Result Value Ref Range    WBC 10.6 4.6 - 13.2 K/uL    RBC 4.53 4.20 - 5.30 M/uL    HGB 12.7 12.0 - 16.0 g/dL    HCT 78.240.2 95.635.0 - 21.345.0 %    MCV 88.7 74.0 - 97.0 FL    MCH 28.0 24.0 - 34.0 PG    MCHC 31.6 31.0 - 37.0 g/dL    RDW 08.615.4 (H) 57.811.6 - 14.5 %    PLATELET 291 135 - 420 K/uL    MPV 10.5 9.2 - 11.8 FL    NEUTROPHILS 61 40 - 73 %    LYMPHOCYTES 27 21 - 52 %    MONOCYTES 8 3 - 10 %    EOSINOPHILS 3 0 - 5 %    BASOPHILS 1 0 - 2 %     ABS. NEUTROPHILS 6.5 1.8 - 8.0 K/UL    ABS. LYMPHOCYTES 2.8 0.9 - 3.6 K/UL    ABS. MONOCYTES 0.9 0.05 - 1.2 K/UL    ABS. EOSINOPHILS 0.3 0.0 - 0.4 K/UL    ABS. BASOPHILS 0.1 0.0 - 0.1 K/UL    DF AUTOMATED     RENAL FUNCTION PANEL    Collection Time: 03/26/16  2:25 AM   Result Value Ref Range    Sodium 138 136 - 145 mmol/L    Potassium 3.9 3.5 - 5.5 mmol/L    Chloride 102 100 - 108 mmol/L    CO2 29 21 - 32 mmol/L    Anion  gap 7 3.0 - 18 mmol/L    Glucose 100 (H) 74 - 99 mg/dL    BUN 5 (L) 7.0 - 18 MG/DL    Creatinine 8.11 0.6 - 1.3 MG/DL    BUN/Creatinine ratio 8 (L) 12 - 20      GFR est AA >60 >60 ml/min/1.27m2    GFR est non-AA >60 >60 ml/min/1.77m2    Calcium 8.8 8.5 - 10.1 MG/DL    Phosphorus 3.0 2.5 - 4.9 MG/DL    Albumin 3.2 (L) 3.4 - 5.0 g/dL   MAGNESIUM    Collection Time: 03/26/16  2:25 AM   Result Value Ref Range    Magnesium 2.2 1.6 - 2.6 mg/dL   GLUCOSE, POC    Collection Time: 03/26/16  5:34 AM   Result Value Ref Range    Glucose (POC) 99 70 - 110 mg/dL         FINAL DIAGNOSIS:   PORTION OF STOMACH:   GASTRIC FUNDIC TISSUE, NO PATHOLOGIC CHANGES.     Assessment:     Active Problems:    GERD (gastroesophageal reflux disease) (03/24/2016)      Diabetes mellitus, type 2 (HCC) (03/24/2016)      Hypertension (03/24/2016)      Urinary, incontinence, stress female (03/24/2016)      OSA (obstructive sleep apnea) (03/24/2016)      Metabolic syndrome (03/24/2016)      Chronic lower back pain (03/24/2016)      Morbid obesity due to excess calories (HCC) (03/24/2016)    Hypertension not best control,  Will restart her usual dose Losartan, continue the Amlodipine and Coreg (new)    Told pt results of pathology.    Not yet meeting criteria to go home: 4 oz/hr liquids PO,  I will discharge her later today if she meets it.    Plan:     Start Losartan  All liquids Tylenol as Hycet did not sit well with her.  This is available OTC as "Tylanol Cold"  One capful is 500 mg, she may take this every 4 hours as needed.     Signed By: Valera Castle, MD     March 26, 2016

## 2016-03-26 NOTE — Progress Notes (Signed)
Surgeon    Called and talked to [employer].  Lacey Ward is doing much better with her drinking today and not having significant nausea problems.  RN French Anaracy has given her all three BP medications this morning.  I note two rather high readings at 0904 and 1314, 168/109 and 166/99.  I asked that another be done.       At 1333  Visit Vitals   ??? BP (!) 157/97 (BP 1 Location: Right arm, BP Patient Position: Sitting)   ??? Pulse 98   ??? Temp 99.5 ??F (37.5 ??C)   ??? Resp 16   ??? Ht 5\' 4"  (1.626 m)   ??? Wt 129.3 kg (285 lb)   ??? SpO2 94%   ??? Breastfeeding No   ??? BMI 48.92 kg/m2     oral intake today 570 ml, she is now tolerating 4 or more oz/hr water.    OK for discharge Home.    This morning I reviewed her Diet, Medications (added back usual dose Losartan), Activity level and warning signs when to contact me.    Follow up 7/20 at 2:30 PM.    Discharge [place]  [id number]

## 2016-03-26 NOTE — Progress Notes (Signed)
0530-Ambulates well.  Minimal pain.  Nausea improved.   Visit Vitals   ??? BP 156/78 (BP Patient Position: At rest)   ??? Pulse 99   ??? Temp 98.2 ??F (36.8 ??C)   ??? Resp 18   ??? Ht 5\' 4"  (1.626 m)   ??? Wt 129.3 kg (285 lb)   ??? SpO2 90%   ??? Breastfeeding No   ??? BMI 48.92 kg/m2

## 2016-03-26 NOTE — Other (Signed)
Patient discharged home. No distress noted.

## 2016-03-26 NOTE — Discharge Summary (Signed)
[facility]  DISCHARGE SUMMARY    Name:  Lacey Ward, Lacey Ward  MR#:  [id number]  DOB:  April 09, 1971  Account #:  [id number]  Date of Adm:  03/24/2016  Date of Discharge:  03/26/2016      ADMISSION DIAGNOSES: Morbid obesity, secondary to excess food  calories, body mass index 49.2.    Co-morbidities:  1. Diabetes (new diagnosis, hemoglobin A1c 6.9).  2. Hypertension.  3. Obstructive sleep apnea.  4. Metabolic syndrome.  5. Gastrointestinal reflux disease.  6. Urinary stress incontinence.  7. Lower back pain.    DISCHARGE DIAGNOSES: Morbid obesity, secondary to excess food  calories, body mass index 49.2.    Co-morbidities:  1. Diabetes (new diagnosis, hemoglobin A1c 6.9).  2. Hypertension.  3. Obstructive sleep apnea.  4. Metabolic syndrome.  5. Gastrointestinal reflux disease.  6. Urinary stress incontinence.  7. Lower back pain.    PROCEDURES PERFORMED:  1. Laparoscopic sleeve gastrectomy constructed over 52 French GiViSi  tube.  2. Intraoperative pneumatic leak testing.    HOSPITAL COURSE: The patient was admitted for an elective  laparoscopic sleeve gastrectomy to treat her morbid obesity and her  multiple weight related co-morbidities as listed above. This was  performed on the day of her admission without difficulty. A 36 French  GiViSi tube and Peri-strips buttressing was used. She  did not have a hiatal hernia.  Her liver appeared normal, as did her  preoperative liver function tests, therefore, no liver biopsy was felt  warranted. Postoperative she did have some nausea and retching. A  scopolamine patch had been placed by Anesthesia. By the following  morning she was still having nausea, but no more retching. Her routine  laboratory studies performed the day after surgery showed a white  blood cell count of 10.7, hematocrit of 39.1, with 68% segmented  neutrophils and 23% lymphocytes. Her electrolyte studies were notable  for a glucose of 118, electrolytes were normal. She did undergo  a  routine upper GI study and I watched with the radiology physician  assistant. This showed no evidence of leak or obstruction. Her point of  care glucose testing overnight was a maximum of 193 just after  surgery and a small dose of insulin was used for this. All subsequent  glucose monitoring was below 126 and no additional insulin was  needed. With her negative upper GI study she was started on clear  liquids supplemented with UNJURY protein drinks. However, for the  first 24 hours she consumed only about total of 450 mL and did not  meet any discharge criteria of 4 or more ounces every hour in order to  be discharged home. On postop day 1 she was started back on her  usual dose of prednisone 5 mg a day and amlodipine 5 mg a day. She  did have some mild tachycardia in the 107 with hypertensive readings,  therefore, Coreg was started instead of her usual losartan. On postop  day #2 repeat laboratory studies showed a white blood cell count of  10.6, hematocrit 40.2, and differential of 61% neutrophils and 23%  lymphocytes. Electrolyte studies were normal, potassium 3.9, creatinine  0.64, and a glucose down to 100. For several hours the second  morning she was tolerating the liquids much better and has now in  over only a few hours consumed 570 mL, now able to tolerate 4 or  more ounces every hour. Her blood pressure, however, has been still  quite elevated and the  usual dose of losartan 50 mg a day was added.  Her most recent blood pressure is 157/97, a pulse of 98. This is better  than earlier in the day when it was 168/109. She has been having no  fevers.    Her discharge medications will be:  1. Lortab elixir 15 mL by mouth every 4 hours as needed. In the  hospital this caused some nausea, and instead she may use just  regular Tylenol elixir 500 mg every 4 hours. (OTC product)  2. She will continue on her Cozaar 50 mg crushed once a day.  3. Amlodipine 5 mg crushed once a day.  4. Carvedilol 3.125 mg tablets, 3  tablets or 9.375 mg twice a day.  5. She may continue her Lipitor.  6. Prednisone.  7. Arava.    I have prescribed Pepcid suspension 20 mg or 2-1/2 mL by mouth  twice a day. This will replace her oral proton pump inhibitor.    Diet will consist of liquids only for the first 2 weeks, she knows to  supplement with high protein, low carbohydrate drinks with a goal of  ingesting greater than 60 grams of protein a day.    Activity level: She may shower normally. I recommend no lifting greater  than 15 pounds for the first 2 weeks, and of course no driving while  taking narcotic pain medications.    Warning signs: I have explained to her the warning signs after bariatric  surgery. This includes a fever greater than 101, worsening abdominal  pain, the development of significant nausea and vomiting and  intolerance to her liquids, and a resting tachycardia greater than 120. I  have also asked her to contact me if she developed lightheaded or  dizziness with standing. She normally does not check glucose levels,  as her diabetes was recently diagnosed and she does not have home  monitoring, nor was she on any anti-diabetes medications.    Followup has been arranged for my office on 04/01/2016 at 2:30 in the  afternoon. At that time if all is well she will be started on routine  bariatric vitamin supplementation.    Discharge Disposition: HOME        Valera CastleOBERT J. Kathelene Rumberger, MD    RC / LS  D:  03/26/2016   14:51  T:  03/26/2016   18:51  Job #:  [id number]    Mila MerryKerry Partis, [facility], [address], [place], [place]  [phone]

## 2016-03-29 DIAGNOSIS — R1031 Right lower quadrant pain: Secondary | ICD-10-CM

## 2016-03-29 NOTE — ED Provider Notes (Signed)
[facility]  Emergency Department Treatment Report        Patient: Lacey Ward Age: 45 y.o. Sex: female    Date of Birth: 03-17-1971 Admit Date: 03/29/2016 PCP: Maylon Peppers, MD   MRN: [id number]  CSN: [id number]     Room: ER15/ER15 Time Dictated: 11:36 PM        Chief Complaint   "I'm afraid I ripped something after my gastric sleeve yesterday"    History of Present Illness   45 y.o. female had a gastric sleeve done yesterday by Dr. Angela Adam today at [facility]. She vomited this evening, felt something "rip" and she is afraid she has done something serious. She denies further vomiting, hematemesis, melena, hematochezia, dizziness, syncope, chest pain, shortness of breath, or any other symptoms or injuries.    Review of Systems   ROS  Constitutional: No fever, chills, or weight loss  Eyes: No visual symptoms.  ENT: No sore throat, runny nose or ear pain.  Respiratory: No cough, dyspnea or wheezing.  Cardiovascular: No chest pain, pressure, palpitations, tightness or heaviness.  Gastrointestinal: No diarrhea.  Genitourinary: No dysuria, frequency, or urgency.  Musculoskeletal: No joint pain or swelling.  Integumentary: No rashes.  Neurological: No headaches, sensory or motor symptoms.  Denies complaints in all other systems.    Past Medical/Surgical History     Past Medical History:   Diagnosis Date   ??? Autoimmune disease ([place])    ??? Diabetes ([place])     no meds   ??? GERD (gastroesophageal reflux disease)    ??? Hypertension    ??? Nausea & vomiting      Past Surgical History:   Procedure Laterality Date   ??? HX ANKLE FRACTURE TX      ankle reconstruction   ??? HX CHOLECYSTECTOMY     ??? HX HYSTERECTOMY      Partial hysterectomy with cystocele and rectocele repair       Social History     Social History     Social History   ??? Marital status: DIVORCED     Spouse name: N/A   ??? Number of children: N/A   ??? Years of education: N/A     Occupational History   ??? Not on file.     Social History Main Topics    ??? Smoking status: Never Smoker   ??? Smokeless tobacco: Never Used   ??? Alcohol use No   ??? Drug use: No   ??? Sexual activity: Not on file     Other Topics Concern   ??? Not on file     Social History Narrative       Family History   No family history on file.    Current Medications     Prior to Admission Medications   Prescriptions Last Dose Informant Patient Reported? Taking?   HYDROcodone-acetaminophen (HYCET) 0.5-21.7 mg/mL oral solution   No No   Sig: Take 15 mL by mouth every four (4) hours as needed. Max Daily Amount: 45 mg. Indications: Pain   adalimumab (HUMIRA) 40 mg/0.8 mL injection   Yes No   Sig: 40 mg by SubCUTAneous route once. Patient takes every other week   Indications: RHEUMATOID ARTHRITIS   amLODIPine (NORVASC) 5 mg tablet   Yes No   Sig: Take 5 mg by mouth daily.   atorvastatin (LIPITOR) 20 mg tablet   Yes No   Sig: Take 10 mg by mouth daily.   carvedilol (COREG) 3.125 mg tablet  No No   Sig: Three tabs (9.375 mg) PO twice daily  Dispense 180 tablets,  refill X 1    Patient s/p sleeve gastrectomy all tablets must be very small.  Indications: hypertension   famotidine (PEPCID) 40 mg/5 mL (8 mg/mL) suspension   No No   Sig: Take 2.5 mL by mouth two (2) times a day.   leflunomide (ARAVA) 10 mg tablet   Yes No   Sig: Take 10 mg by mouth daily. Indications: RHEUMATOID ARTHRITIS   losartan (COZAAR) 50 mg tablet   Yes No   Sig: Take 50 mg by mouth daily.   predniSONE (DELTASONE) 5 mg tablet   Yes No   Sig: Take  by mouth daily. Indications: RHEUMATOID ARTHRITIS      Facility-Administered Medications: None       Allergies     Allergies   Allergen Reactions   ??? Latex Swelling   ??? Pcn [Penicillins] Rash       Physical Exam   ED Triage Vitals   Enc Vitals Group      BP 03/29/16 2324 139/74      Pulse (Heart Rate) 03/29/16 2324 94      Resp Rate 03/29/16 2324 22      Temp 03/29/16 2324 97.4 ??F (36.3 ??C)      Temp src --       O2 Sat (%) 03/29/16 2324 98 %      Weight 03/29/16 2324 273 lb       Height 03/29/16 2324 5' 6"       Head Cir --       Peak Flow --       Pain Score --       Pain Loc --       Pain Edu? --       Excl. in [place]? --      Physical Exam   Constitutional: She is oriented to person, place, and time.   The patient and not is not in any respiratory distress, is extremely concerned about the possibility that she poured something in her abdomen status post the surgery.   HENT:   Head: Normocephalic and atraumatic.   Mouth/Throat: Oropharynx is clear and moist.   Eyes: Conjunctivae and EOM are normal. Pupils are equal, round, and reactive to light.   Neck: Normal range of motion. Neck supple.   Cardiovascular: Normal rate, regular rhythm and normal heart sounds.    Pulmonary/Chest: Effort normal and breath sounds normal.   Abdominal:   Abdomen is soft, tender only to the right of the umbilicus where the patient states she feels a mass although I do not feel it. Bowel sounds normal, no rebound, distention, or CVAT.   Musculoskeletal: Normal range of motion.   Neurological: She is alert and oriented to person, place, and time. She exhibits normal muscle tone.   Skin: Skin is warm and dry.   Psychiatric: Affect and judgment normal.   Nursing note and vitals reviewed.       Impression and Management Plan   The patient will need a CT with IV and oral contrast to ensure the integrity of the surgical procedure.    Diagnostic Studies   Lab:   Recent Results (from the past 12 hour(s))   CBC WITH AUTOMATED DIFF    Collection Time: 03/30/16 12:12 AM   Result Value Ref Range    WBC 13.7 (H) 4.0 - 11.0 1000/mm3    RBC 5.14 3.60 - 5.20  M/uL    HGB 14.4 13.0 - 17.2 gm/dl    HCT 43.7 37.0 - 50.0 %    MCV 85.0 80.0 - 98.0 fL    MCH 28.0 25.4 - 34.6 pg    MCHC 33.0 30.0 - 36.0 gm/dl    PLATELET 339 140 - 450 1000/mm3    MPV 10.1 (H) 6.0 - 10.0 fL    RDW-SD 45.6 36.4 - 46.3      NRBC 0 0 - 0      IMMATURE GRANULOCYTES 0.7 0.0 - 3.0 %    NEUTROPHILS 69.1 (H) 34 - 64 %    LYMPHOCYTES 17.0 (L) 28 - 48 %     MONOCYTES 8.0 1 - 13 %    EOSINOPHILS 4.2 0 - 5 %    BASOPHILS 1.0 0 - 3 %   METABOLIC PANEL, COMPREHENSIVE    Collection Time: 03/30/16 12:12 AM   Result Value Ref Range    Sodium 133 (L) 136 - 145 mEq/L    Potassium 3.2 (L) 3.5 - 5.1 mEq/L    Chloride 95 (L) 98 - 107 mEq/L    CO2 25 21 - 32 mEq/L    Glucose 117 (H) 74 - 106 mg/dl    BUN 19 7 - 25 mg/dl    Creatinine 0.9 0.6 - 1.3 mg/dl    GFR est AA >60.0      GFR est non-AA >60      Calcium 9.3 8.5 - 10.1 mg/dl    AST (SGOT) 28 15 - 37 U/L    ALT (SGPT) 101 (H) 12 - 78 U/L    Alk. phosphatase 151 (H) 45 - 117 U/L    Bilirubin, total 1.2 (H) 0.2 - 1.0 mg/dl    Protein, total 8.3 (H) 6.4 - 8.2 gm/dl    Albumin 3.7 3.4 - 5.0 gm/dl     Labs Reviewed   CBC WITH AUTOMATED DIFF - Abnormal; Notable for the following:        Result Value    WBC 13.7 (*)     MPV 10.1 (*)     NEUTROPHILS 69.1 (*)     LYMPHOCYTES 17.0 (*)     All other components within normal limits   METABOLIC PANEL, COMPREHENSIVE - Abnormal; Notable for the following:     Sodium 133 (*)     Potassium 3.2 (*)     Chloride 95 (*)     Glucose 117 (*)     ALT (SGPT) 101 (*)     Alk. phosphatase 151 (*)     Bilirubin, total 1.2 (*)     Protein, total 8.3 (*)     All other components within normal limits       Imaging:    No results found.    CT of the abdomen and pelvis with IV and oral contrast show no abnormalities, do show the gastric sleeve and position. This is read by [facility].    ED Course   The patient was reassured that the CT scan is normal, but is concerned as to why she has some much pain. This did happen after vomiting episode, we discussed that it could be a muscle pull. I'll put her on Robaxin and a few Norco, she cannot take NSAIDs as she just had the bypass done.    Final Diagnosis       ICD-10-CM ICD-9-CM   1. Abdominal wall pain in right lower quadrant R10.31 789.03  Disposition   The patient is discharged home in stable condition, with instructions to  follow-up with their regular doctor.  They are advised to return immediately for any worsening or symptoms of concern.  Helaine Chess, MD  March 30, 2016    My signature above authenticates this document and my orders, the final ??  diagnosis (es), discharge prescription (s), and instructions in the Epic ??  record.  If you have any questions please contact [phone].  ??  Nursing notes have been reviewed by the physician/ advanced practice ??  Clinician.    Dragon medical dictation software was used for portions of this report. Unintended transcription errors may occur.

## 2016-03-29 NOTE — ED Notes (Signed)
[facility], pt had gastric bypass on Wednesday. Pt started vomited today and having RUQ pain today. Pt states she has been having intense pain 10/10. Pt refused IV and meds from [facility]. Pt states she was a diabetic prior to surgery, BS- 128.

## 2016-03-30 ENCOUNTER — Inpatient Hospital Stay
Admit: 2016-03-30 | Discharge: 2016-03-30 | Disposition: A | Discharge status: Home / Self Care | Payer: BLUE CROSS/BLUE SHIELD | Attending: Emergency Medicine

## 2016-03-30 ENCOUNTER — Emergency Department: Admit: 2016-03-30 | Payer: BLUE CROSS/BLUE SHIELD | Primary: Family Medicine

## 2016-03-30 LAB — CBC WITH AUTOMATED DIFF
BASOPHILS: 1 % (ref 0–3)
EOSINOPHILS: 4.2 % (ref 0–5)
HCT: 43.7 % (ref 37.0–50.0)
HGB: 14.4 gm/dl (ref 13.0–17.2)
IMMATURE GRANULOCYTES: 0.7 % (ref 0.0–3.0)
LYMPHOCYTES: 17 % — ABNORMAL LOW (ref 28–48)
MCH: 28 pg (ref 25.4–34.6)
MCHC: 33 gm/dl (ref 30.0–36.0)
MCV: 85 fL (ref 80.0–98.0)
MONOCYTES: 8 % (ref 1–13)
MPV: 10.1 fL — ABNORMAL HIGH (ref 6.0–10.0)
NEUTROPHILS: 69.1 % — ABNORMAL HIGH (ref 34–64)
NRBC: 0 (ref 0–0)
PLATELET: 339 10*3/uL (ref 140–450)
RBC: 5.14 M/uL (ref 3.60–5.20)
RDW-SD: 45.6 (ref 36.4–46.3)
WBC: 13.7 10*3/uL — ABNORMAL HIGH (ref 4.0–11.0)

## 2016-03-30 LAB — METABOLIC PANEL, COMPREHENSIVE
ALT (SGPT): 101 U/L — ABNORMAL HIGH (ref 12–78)
AST (SGOT): 28 U/L (ref 15–37)
Albumin: 3.7 gm/dl (ref 3.4–5.0)
Alk. phosphatase: 151 U/L — ABNORMAL HIGH (ref 45–117)
BUN: 19 mg/dl (ref 7–25)
Bilirubin, total: 1.2 mg/dl — ABNORMAL HIGH (ref 0.2–1.0)
CO2: 25 mEq/L (ref 21–32)
Calcium: 9.3 mg/dl (ref 8.5–10.1)
Chloride: 95 mEq/L — ABNORMAL LOW (ref 98–107)
Creatinine: 0.9 mg/dl (ref 0.6–1.3)
GFR est AA: >60
GFR est non-AA: >60
Glucose: 117 mg/dl — ABNORMAL HIGH (ref 74–106)
Potassium: 3.2 mEq/L — ABNORMAL LOW (ref 3.5–5.1)
Protein, total: 8.3 gm/dl — ABNORMAL HIGH (ref 6.4–8.2)
Sodium: 133 mEq/L — ABNORMAL LOW (ref 136–145)

## 2016-03-30 MED ORDER — DIATRIZOATE MEGLUMINE & SODIUM 66 %-10 % ORAL SOLN
66-10 % | Freq: Once | ORAL | Status: AC
Start: 2016-03-30 — End: 2016-03-30
  Administered 2016-03-30: 05:00:00 via ORAL

## 2016-03-30 MED ORDER — IOPAMIDOL 76 % IV SOLN
370 mg iodine /mL (76 %) | Freq: Once | INTRAVENOUS | Status: AC
Start: 2016-03-30 — End: 2016-03-30
  Administered 2016-03-30: 05:00:00 via INTRAVENOUS

## 2016-03-30 MED ORDER — SODIUM CHLORIDE 0.9 % IJ SYRG
Freq: Once | INTRAMUSCULAR | Status: DC
Start: 2016-03-30 — End: 2016-03-30

## 2016-03-30 MED ORDER — METHOCARBAMOL 750 MG TAB
750 mg | ORAL_TABLET | Freq: Four times a day (QID) | ORAL | 0 refills | Status: AC
Start: 2016-03-30 — End: ?

## 2016-03-30 MED ORDER — HYDROCODONE-ACETAMINOPHEN 5 MG-325 MG TAB
5-325 mg | ORAL_TABLET | ORAL | 0 refills | Status: AC | PRN
Start: 2016-03-30 — End: ?

## 2016-03-30 MED FILL — ISOVUE-370  76 % INTRAVENOUS SOLUTION: 370 mg iodine /mL (76 %) | INTRAVENOUS | Qty: 80

## 2016-03-30 MED FILL — MD-GASTROVIEW 66 %-10 % ORAL SOLUTION: 66-10 % | ORAL | Qty: 30

## 2016-03-30 NOTE — ED Notes (Signed)
2:47 AM  03/30/16     Discharge instructions given to Lacey Ward (name) with verbalization of understanding. Patient accompanied by family.  Patient discharged with the following prescriptions norco and robaxin. Patient discharged to home (destination).      Wenda Overlandanyell K Thomas, RN

## 2016-03-31 NOTE — Telephone Encounter (Signed)
Surgery:  Diet: bariatric clear liquids.  Protein supplement: not tolerating protein at all. States it makes her nauseated and she is terrified of vomiting again. Recommended isopure or premier clear.  Water/fluids: tolerating 64oz daily  Flintstones/MVI: will start after appt with dr. Nickie Retortchastanet  Vit. B12/Vit D3:  Calcium Citrate:  Pain: only taking liquid tylenol  Ambulation: walking daily  Temp:  Pulse:  Drain care: n/a  Lovenox: n/a      Constipation:reports loose BM's and flatus  Support Group:      Comments: vomited on 7/18 and it felt like something "popped".  Went to the ER and CT scan done.  No sign of a leak.  Patient states that to the right of her umbilicus is still very painful.  Recommended that she alternates heat and ice for pain.  Patient is going to call Dr. Anselm Pancoasthastanet's office to try and get an appt today. Patient states that she is "exhausted".  Informed patient that since she has not had any protein that might be the cause.  Encouraged patient to find another brand of protein that she can tolerate.

## 2016-05-06 ENCOUNTER — Inpatient Hospital Stay: Admit: 2016-05-06 | Primary: Family Medicine

## 2016-05-06 NOTE — Progress Notes (Signed)
[facility]  POST-OP NUTRITION FOLLOW UP    Patient's Name: Lacey ByesSerena Colton  Date of Birth: 01/09/1971  Surgery Date: 03/24/16    Procedure: Gastric Sleeve    Height: 5 f 6   Pre-Op Weight: 309   Current Weight: 263  Weight Lost: 46  BMI:      Attendance of support group:   When:   Why not:     Complications  Readmittance: None  Reoperations: None  Complications: None  IV Fluids: None  ER Trips: [facility] to [facility] at [facility] days post-op.  She states she felt something "pop". Tests were done, which concluded it was just a pulled muscle.    Problem Areas:   Nausea: Early on, but this has subsided   Vomiting 1-2 x from eating too fast and too much   Dumping Syndrome: None  Inadequate Protein:  Yes.  Patient is not doing the shakes.  Inadequate Fluids: None  Food Intolerance: None  Hunger: Sometimes in the morning.  She states she typically has to make herself eat.    Constipation: Yes.   States she has had IBS with constipation.  She is taking Miralax.      Eating 3 Meals/Day: Yes  Portion Size at Meals: 1 -2 ounces   Protein from Food:     Foods being consumed:  Breakfast: Time:8:00 am:  1/2 cup of [place]ik[place] yogurt or 1/2 cup of low fat cottage cheese or a string cheese  Lunch: Time: 12:00 pm:   String cheese, cottage cheese, yogurt  Dinner:  Time: 5-6 pm:   Protein (2 ounces) and incorporating some vegetables  In-between eating:  States that on two occasions she has had 1-2 bites of a chocolate candy.    Length of time for meals: 20 minutes or less Separating food/fluids: Yes    Fluids: 64 oz/day Types of Fluids: Water    MVI: flintstones  Number/Day: bid Taken Separately: yes    Calcium: Bariatric Advantage Chewable  Calcium Dosing: tid  Taken Separately: yes    Vitamin B12: sublingual Vitamin B12 Dosing: 1000 mcg    Vitamin D:    Vitamin D dosing: 5000 IU    Iron: n/a  Iron dosing:      Protein Supplement: n/a   Grams of Protein:    Mixed with:    Splitting Protein Drink in 1/2:     Timing of Protein Drinks:   Patient is taking  days a week.    Exercise (FITT): Bought an elliptical and doing 3-4 miles per day      Comments:  Overall doing well.  Her portions are around 1-2 ounces.  She states she is sticking to soft protein only.  She was treating herself to a piece of chocolate once a week, but I have discouraged this of fears of the slippery slope it can get her on.  SHe is taking her vitamins, as instructed, but is not doing her protein drinks stating that she does not like them.  I have made suggestions of other ones that may be more tolerable.        Patient was educated on the importance of eating meat and vegetables only.  I have talked with patient about the effects of carbohydrates, not only from a weight management perspective, but also what effects it could have on their blood sugar and what reactive hypoglycemia is.        Diet Follow Up:  9 month class scheduled for:  Patient will call back    Adela Ports, MS RD    05/06/2016
